# Patient Record
Sex: Male | Born: 1960 | Race: White | Hispanic: No | Marital: Married | Smoking: Never smoker
Health system: Southern US, Community
[De-identification: ages and names within clinical notes are randomized; demographics above are authoritative.]

## PROBLEM LIST (undated history)

## (undated) DIAGNOSIS — N200 Calculus of kidney: Secondary | ICD-10-CM

## (undated) DIAGNOSIS — G4730 Sleep apnea, unspecified: Secondary | ICD-10-CM

## (undated) DIAGNOSIS — M1990 Unspecified osteoarthritis, unspecified site: Secondary | ICD-10-CM

## (undated) DIAGNOSIS — I10 Essential (primary) hypertension: Secondary | ICD-10-CM

## (undated) DIAGNOSIS — Z87442 Personal history of urinary calculi: Secondary | ICD-10-CM

## (undated) DIAGNOSIS — D649 Anemia, unspecified: Secondary | ICD-10-CM

## (undated) DIAGNOSIS — K5790 Diverticulosis of intestine, part unspecified, without perforation or abscess without bleeding: Secondary | ICD-10-CM

## (undated) DIAGNOSIS — L02211 Cutaneous abscess of abdominal wall: Secondary | ICD-10-CM

## (undated) DIAGNOSIS — E785 Hyperlipidemia, unspecified: Secondary | ICD-10-CM

## (undated) DIAGNOSIS — N189 Chronic kidney disease, unspecified: Secondary | ICD-10-CM

## (undated) DIAGNOSIS — K219 Gastro-esophageal reflux disease without esophagitis: Secondary | ICD-10-CM

## (undated) HISTORY — PX: LAMINECTOMY: SHX219

## (undated) HISTORY — PX: BACK SURGERY: SHX140

## (undated) HISTORY — PX: CHOLECYSTECTOMY: SHX55

## (undated) SURGERY — LAPAROSCOPY, DIAGNOSTIC
Anesthesia: General

---

## 2004-04-08 ENCOUNTER — Other Ambulatory Visit: Payer: Self-pay

## 2004-04-12 ENCOUNTER — Ambulatory Visit: Payer: Self-pay | Admitting: Unknown Physician Specialty

## 2004-09-17 ENCOUNTER — Emergency Department: Payer: Self-pay | Admitting: Emergency Medicine

## 2005-11-20 ENCOUNTER — Emergency Department: Payer: Self-pay | Admitting: Internal Medicine

## 2005-11-21 ENCOUNTER — Ambulatory Visit: Payer: Self-pay | Admitting: Internal Medicine

## 2005-11-22 ENCOUNTER — Emergency Department: Payer: Self-pay | Admitting: Unknown Physician Specialty

## 2006-08-24 ENCOUNTER — Ambulatory Visit: Payer: Self-pay

## 2006-10-09 ENCOUNTER — Ambulatory Visit: Payer: Self-pay | Admitting: Cardiology

## 2007-02-23 ENCOUNTER — Emergency Department: Payer: Self-pay | Admitting: Emergency Medicine

## 2007-07-05 HISTORY — PX: CARDIAC CATHETERIZATION: SHX172

## 2011-07-05 HISTORY — PX: SPINAL FUSION W/ LUQUE UNIT ROD: SHX2426

## 2011-11-04 DIAGNOSIS — M4310 Spondylolisthesis, site unspecified: Secondary | ICD-10-CM | POA: Insufficient documentation

## 2011-11-04 DIAGNOSIS — M48062 Spinal stenosis, lumbar region with neurogenic claudication: Secondary | ICD-10-CM | POA: Insufficient documentation

## 2011-11-04 DIAGNOSIS — IMO0002 Reserved for concepts with insufficient information to code with codable children: Secondary | ICD-10-CM | POA: Insufficient documentation

## 2012-03-07 DIAGNOSIS — M25569 Pain in unspecified knee: Secondary | ICD-10-CM | POA: Insufficient documentation

## 2013-01-10 ENCOUNTER — Emergency Department: Payer: Self-pay | Admitting: Unknown Physician Specialty

## 2013-01-10 LAB — COMPREHENSIVE METABOLIC PANEL
Albumin: 3.8 g/dL (ref 3.4–5.0)
Alkaline Phosphatase: 131 U/L (ref 50–136)
Calcium, Total: 8.8 mg/dL (ref 8.5–10.1)
Chloride: 105 mmol/L (ref 98–107)
Co2: 26 mmol/L (ref 21–32)
Creatinine: 0.99 mg/dL (ref 0.60–1.30)
EGFR (Non-African Amer.): >60
Glucose: 107 mg/dL — ABNORMAL HIGH (ref 65–99)
Osmolality: 277 (ref 275–301)
Potassium: 3.9 mmol/L (ref 3.5–5.1)
SGOT(AST): 25 U/L (ref 15–37)

## 2013-01-10 LAB — CBC
MCHC: 34 g/dL (ref 32.0–36.0)
RBC: 4.66 10*6/uL (ref 4.40–5.90)
RDW: 13.6 % (ref 11.5–14.5)
WBC: 8.7 10*3/uL (ref 3.8–10.6)

## 2013-01-10 LAB — CK TOTAL AND CKMB (NOT AT ARMC): CK-MB: 1 ng/mL (ref 0.5–3.6)

## 2013-01-10 LAB — TROPONIN I: Troponin-I: <0.02 ng/mL

## 2013-11-04 DIAGNOSIS — M7550 Bursitis of unspecified shoulder: Secondary | ICD-10-CM | POA: Insufficient documentation

## 2013-11-04 DIAGNOSIS — M67919 Unspecified disorder of synovium and tendon, unspecified shoulder: Secondary | ICD-10-CM | POA: Insufficient documentation

## 2013-11-04 DIAGNOSIS — M719 Bursopathy, unspecified: Secondary | ICD-10-CM

## 2013-11-04 DIAGNOSIS — M542 Cervicalgia: Secondary | ICD-10-CM | POA: Insufficient documentation

## 2013-12-17 DIAGNOSIS — E291 Testicular hypofunction: Secondary | ICD-10-CM | POA: Insufficient documentation

## 2014-07-31 DIAGNOSIS — G4733 Obstructive sleep apnea (adult) (pediatric): Secondary | ICD-10-CM | POA: Insufficient documentation

## 2015-10-27 ENCOUNTER — Other Ambulatory Visit: Payer: Self-pay | Admitting: Family Medicine

## 2015-10-27 DIAGNOSIS — N2889 Other specified disorders of kidney and ureter: Secondary | ICD-10-CM

## 2015-10-29 ENCOUNTER — Ambulatory Visit
Admission: RE | Admit: 2015-10-29 | Discharge: 2015-10-29 | Disposition: A | Discharge status: Home / Self Care | Payer: BC Managed Care – PPO | Source: Ambulatory Visit | Attending: Family Medicine | Admitting: Family Medicine

## 2015-10-29 DIAGNOSIS — N2889 Other specified disorders of kidney and ureter: Secondary | ICD-10-CM | POA: Diagnosis present

## 2015-10-29 DIAGNOSIS — K760 Fatty (change of) liver, not elsewhere classified: Secondary | ICD-10-CM | POA: Insufficient documentation

## 2015-10-29 DIAGNOSIS — R938 Abnormal findings on diagnostic imaging of other specified body structures: Secondary | ICD-10-CM | POA: Diagnosis not present

## 2015-10-29 MED ORDER — GADOBENATE DIMEGLUMINE 529 MG/ML IV SOLN
20.0000 mL | Freq: Once | INTRAVENOUS | Status: AC | PRN
  Administered 2015-10-29: 20 mL via INTRAVENOUS

## 2016-01-22 ENCOUNTER — Other Ambulatory Visit: Payer: Self-pay | Admitting: Family Medicine

## 2016-01-22 ENCOUNTER — Ambulatory Visit
Admission: RE | Admit: 2016-01-22 | Discharge: 2016-01-22 | Disposition: A | Discharge status: Home / Self Care | Payer: BC Managed Care – PPO | Source: Ambulatory Visit | Attending: Family Medicine | Admitting: Family Medicine

## 2016-01-22 DIAGNOSIS — R1011 Right upper quadrant pain: Secondary | ICD-10-CM

## 2016-01-22 DIAGNOSIS — I700 Atherosclerosis of aorta: Secondary | ICD-10-CM | POA: Insufficient documentation

## 2016-01-22 DIAGNOSIS — R932 Abnormal findings on diagnostic imaging of liver and biliary tract: Secondary | ICD-10-CM | POA: Diagnosis not present

## 2016-01-22 HISTORY — DX: Essential (primary) hypertension: I10

## 2016-01-22 MED ORDER — IOPAMIDOL (ISOVUE-300) INJECTION 61%
100.0000 mL | Freq: Once | INTRAVENOUS | Status: AC | PRN
  Administered 2016-01-22: 100 mL via INTRAVENOUS

## 2016-01-26 ENCOUNTER — Other Ambulatory Visit: Payer: Self-pay

## 2016-01-26 DIAGNOSIS — F419 Anxiety disorder, unspecified: Secondary | ICD-10-CM | POA: Insufficient documentation

## 2016-01-26 DIAGNOSIS — E785 Hyperlipidemia, unspecified: Secondary | ICD-10-CM | POA: Insufficient documentation

## 2016-01-26 DIAGNOSIS — K219 Gastro-esophageal reflux disease without esophagitis: Secondary | ICD-10-CM | POA: Insufficient documentation

## 2016-01-26 DIAGNOSIS — I10 Essential (primary) hypertension: Secondary | ICD-10-CM | POA: Insufficient documentation

## 2016-01-27 ENCOUNTER — Ambulatory Visit: Payer: Self-pay | Admitting: Surgery

## 2016-01-28 ENCOUNTER — Telehealth: Payer: Self-pay

## 2016-01-28 ENCOUNTER — Ambulatory Visit (INDEPENDENT_AMBULATORY_CARE_PROVIDER_SITE_OTHER): Payer: Self-pay | Admitting: Surgery

## 2016-01-28 ENCOUNTER — Other Ambulatory Visit: Payer: Self-pay

## 2016-01-28 ENCOUNTER — Encounter: Payer: Self-pay | Admitting: Surgery

## 2016-01-28 ENCOUNTER — Other Ambulatory Visit
Admission: RE | Admit: 2016-01-28 | Discharge: 2016-01-28 | Disposition: A | Discharge status: Home / Self Care | Payer: BC Managed Care – PPO | Source: Ambulatory Visit | Attending: Surgery | Admitting: Surgery

## 2016-01-28 VITALS — BP 107/55 | HR 83 | Temp 98.8°F | Ht 66.0 in | Wt 250.0 lb

## 2016-01-28 DIAGNOSIS — L0390 Cellulitis, unspecified: Secondary | ICD-10-CM

## 2016-01-28 DIAGNOSIS — R1011 Right upper quadrant pain: Secondary | ICD-10-CM | POA: Diagnosis not present

## 2016-01-28 DIAGNOSIS — L0291 Cutaneous abscess, unspecified: Secondary | ICD-10-CM

## 2016-01-28 LAB — COMPREHENSIVE METABOLIC PANEL
ALBUMIN: 3.3 g/dL — AB (ref 3.5–5.0)
ALT: 103 U/L — AB (ref 17–63)
AST: 52 U/L — AB (ref 15–41)
Alkaline Phosphatase: 100 U/L (ref 38–126)
Anion gap: 12 (ref 5–15)
BUN: 18 mg/dL (ref 6–20)
CHLORIDE: 97 mmol/L — AB (ref 101–111)
CO2: 25 mmol/L (ref 22–32)
CREATININE: 0.99 mg/dL (ref 0.61–1.24)
Calcium: 9.4 mg/dL (ref 8.9–10.3)
GFR calc Af Amer: >60 mL/min (ref >60)
GLUCOSE: 102 mg/dL — AB (ref 65–99)
POTASSIUM: 4.4 mmol/L (ref 3.5–5.1)
Sodium: 134 mmol/L — ABNORMAL LOW (ref 135–145)
Total Bilirubin: 1 mg/dL (ref 0.3–1.2)
Total Protein: 7.8 g/dL (ref 6.5–8.1)

## 2016-01-28 LAB — CBC WITH DIFFERENTIAL/PLATELET
BASOS ABS: 0.1 10*3/uL (ref 0–0.1)
BASOS PCT: 1 %
EOS PCT: 1 %
Eosinophils Absolute: 0.1 10*3/uL (ref 0–0.7)
HCT: 39.5 % — ABNORMAL LOW (ref 40.0–52.0)
Hemoglobin: 12.9 g/dL — ABNORMAL LOW (ref 13.0–18.0)
LYMPHS PCT: 13 %
Lymphs Abs: 1.3 10*3/uL (ref 1.0–3.6)
MCH: 27.8 pg (ref 26.0–34.0)
MCHC: 32.7 g/dL (ref 32.0–36.0)
MCV: 85.2 fL (ref 80.0–100.0)
Monocytes Absolute: 0.9 10*3/uL (ref 0.2–1.0)
Monocytes Relative: 9 %
NEUTROS ABS: 7.7 10*3/uL — AB (ref 1.4–6.5)
Neutrophils Relative %: 76 %
PLATELETS: 489 10*3/uL — AB (ref 150–440)
RBC: 4.64 MIL/uL (ref 4.40–5.90)
RDW: 13.8 % (ref 11.5–14.5)
WBC: 10 10*3/uL (ref 3.8–10.6)

## 2016-01-28 MED ORDER — AMOXICILLIN-POT CLAVULANATE 875-125 MG PO TABS: 1.0000 | ORAL_TABLET | Freq: Two times a day (BID) | ORAL | 1 refills | Status: DC

## 2016-01-28 NOTE — Consult Note (Signed)
Surgical Consultation  01/28/2016  Danny Klein is an 55 y.o. male.   CC: Right-sided abdominal pain  HPI: This a patient who had an emergency cholecystectomy performed laparoscopically at [facility] in April 2017. He had elevated liver function tests the day after surgery but was discharged with a diagnosis of hydropic acute cholecystitis. 3 weeks later he had episodes of pain and not feeling well and then developed fevers and a workup suggested a fluid collection but definite fluid collection apparently was too small to drain and he was placed on the first IV then oral antibiotics and he resolved. He was doing fine until this last week at which time he started having increasing pain nausea to emesis loose stools and fevers to 102 his last fever to 102 was on Monday 4 days ago but last night he had a temp of under 100 at 99.6. Has been on oral Augmentin since Friday when he had a CT scan suggesting inflammation but no abscess. He complains of worsening stabbing pain that comes and goes and is been worsening over the last week. 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 Hypertension     Past Surgical History:  Procedure Laterality Date  . CARDIAC CATHETERIZATION  2009  . CHOLECYSTECTOMY    . SPINAL FUSION W/ LUQUE UNIT ROD  2013    Family History  Problem Relation Age of Onset  . Cancer Mother   . Heart disease Mother   . Heart disease Father     Social History:  reports that he has never smoked. He has never used smokeless tobacco. His alcohol and drug histories are not on file.  Allergies:  Allergies  Allergen Reactions  . Hydrocodone Itching and Other (See Comments)  . Meperidine Other (See Comments)    Nausea and vomiting , talking out of head     Medications reviewed.   Review of Systems:   Review of Systems  Constitutional: Positive for chills, fever,  malaise/fatigue and weight loss.  HENT: Negative.   Eyes: Negative.   Respiratory: Negative.   Cardiovascular: Negative.   Gastrointestinal: Positive for abdominal pain, diarrhea, nausea and vomiting. Negative for blood in stool, constipation, heartburn and melena.  Genitourinary: Negative.   Musculoskeletal: Negative.   Skin: Negative.   Neurological: Negative.   Endo/Heme/Allergies: Negative.   Psychiatric/Behavioral: Negative.      Physical Exam:  BP (!) 107/55 (BP Location: Right Arm, Patient Position: Sitting, Cuff Size: Normal)   Pulse 83   Temp 98.8 F (37.1 C) (Oral)   Ht  (1.676 m)   Wt 250 lb (113.4 kg)   BMI 40.35 kg/m   Physical Exam  Constitutional: He is oriented to person, place, and time. He appears distressed.  Uncomfortable-appearing male patient holding his right side  HENT:  Head: Normocephalic and atraumatic.  Eyes: Right eye exhibits no discharge. Left eye exhibits no discharge. No scleral icterus.  Neck: Normal range of motion.  Cardiovascular: Normal rate, regular rhythm and normal heart sounds.   Pulmonary/Chest: Effort normal and breath sounds normal. No respiratory distress. He has no wheezes. He has no rales.  Abdominal: Soft. He exhibits no distension. There is tenderness. There is no rebound and no guarding.  Tenderness in the right abdomen and right flank without peritoneal signs negative Rovsing sign no guarding no rebound no percussion tenderness Scars from recent cholecystectomy  are healing well without erythema or drainage no jaundice  Musculoskeletal: Normal range of motion. He exhibits no edema or tenderness.  Lymphadenopathy:    He has no cervical adenopathy.  Neurological: He is alert and oriented to person, place, and time.  Skin: Skin is warm and dry. No rash noted. He is not diaphoretic. No erythema.  Vitals reviewed.     No results found for this or any previous visit (from the past 48 hour(s)). No results  found.  Assessment/Plan:  CT scan from last week is personally reviewed showing inflammatory process such as a phlegmon without obvious drainable pocket. White blood cell count is normal as are LFTs.  Review of operative report from [facility] as well as perioperative labs and studies at that time and recent MRI personally. At an elevated white blood cell count with normal labs preoperatively (LFTs) but then had elevated LFTs postoperatively  Etiology of this patient's pain is clearly related to the phlegmon in his right flank but the etiology of that phlegmon is not clear to me. While it likely represents an inflammatory process following the cholecystectomy I see no sign that this would be a bile leak although HIDA scan may be helpful there. Additionally a repeat CT scan may be of value as well.  I spent more hour 20 minutes discussing with this patient possible etiologies. I doubt that this is kidney stone related as does the patient he has had them multiple times before and this is nothing like that. Location of phlegmon suggests it is related to his post cholecystectomy recovery. His fevers have improved but his pain has not and without in mind I will order CT scan today I will refill his Augmentin and recheck CBC and CMP. The CT scan suggests a drainable collection that could be done at the hospital. If there is no drainable collection then I would recommend staying on oral antibiotics and being seen next week. Should he worsen at any time admission to the hospital and IV antibiotics may be indicated.  The patient does not want to return to [facility] but I also suggested that if we cannot find an identifiable source or cause for this patient's phlegmon and it does not improve rapidly then referral to [facility] might be necessary in view of the hepatobiliary service. She was in agreement and understanding of this plan.    Lattie Haw, MD, FACS

## 2016-01-28 NOTE — Telephone Encounter (Signed)
Patient notified of prior authorization for CT scan.  Call placed to angie at this time.  Discussed with patient that I would notify him as soon as I hear from [employer].  Patient verbalized understanding.

## 2016-01-28 NOTE — Patient Instructions (Addendum)
Please call our office if you have questions or concerns. I will call you later with an appointment for your CT scan.

## 2016-01-29 ENCOUNTER — Ambulatory Visit
Admission: RE | Admit: 2016-01-29 | Discharge: 2016-01-29 | Disposition: A | Discharge status: Home / Self Care | Payer: BC Managed Care – PPO | Source: Ambulatory Visit | Attending: Surgery | Admitting: Surgery

## 2016-01-29 DIAGNOSIS — R1011 Right upper quadrant pain: Secondary | ICD-10-CM | POA: Diagnosis not present

## 2016-01-29 MED ORDER — IOPAMIDOL (ISOVUE-300) INJECTION 61%
125.0000 mL | Freq: Once | INTRAVENOUS | Status: AC | PRN
  Administered 2016-01-29: 11:00:00 via INTRAVENOUS

## 2016-01-29 NOTE — Telephone Encounter (Signed)
Contacted  Dr.Loflin at this time-Waiting for a response. For her to check CT results.

## 2016-01-29 NOTE — Telephone Encounter (Signed)
Patient called and wanted to know the results of his CT scan.  I messaged Dr.Loflin and have not received a response due to her being in surgery.  I told patient that the  Ct scan was still under review and it would most likely be Monday before I would have any results for him.  Patient stated he was continuing to run a low grade fever in the evenings and I asked if he was still taking his antibiotic and was told yes. I told him if he gets worse to go to the ED. Patient verbalized understanding.

## 2016-02-01 NOTE — Telephone Encounter (Signed)
Per Dr. Tonita Cong. Stated if patient has elevated fever, increased pain to go to ED if he does not want to wait until his appointment on 02/04/16.  I spoke with patient and we discussed the above. The patient stated the last time he visited the ED he did not have a good experience and unless he gets worse he would see [place] at his appointment on 02/03/16. He stated he was feeling the same as when he was in the office last week. He was instructed to continue his antibiotic and to go to the ED if he gets worse. Patient verbalized understanding.

## 2016-02-02 ENCOUNTER — Ambulatory Visit: Payer: BC Managed Care – PPO

## 2016-02-03 ENCOUNTER — Encounter: Payer: Self-pay | Admitting: Surgery

## 2016-02-03 ENCOUNTER — Inpatient Hospital Stay
Admission: AD | Admit: 2016-02-03 | Discharge: 2016-02-05 | DRG: 373 | Disposition: A | Discharge status: Home / Self Care | Payer: BC Managed Care – PPO | Source: Ambulatory Visit | Attending: Surgery | Admitting: Surgery

## 2016-02-03 ENCOUNTER — Ambulatory Visit (INDEPENDENT_AMBULATORY_CARE_PROVIDER_SITE_OTHER): Payer: BC Managed Care – PPO | Admitting: Surgery

## 2016-02-03 ENCOUNTER — Telehealth: Payer: Self-pay

## 2016-02-03 VITALS — BP 93/62 | HR 84 | Temp 98.0°F | Ht 66.0 in | Wt 249.0 lb

## 2016-02-03 DIAGNOSIS — I10 Essential (primary) hypertension: Secondary | ICD-10-CM | POA: Diagnosis present

## 2016-02-03 DIAGNOSIS — G8918 Other acute postprocedural pain: Secondary | ICD-10-CM | POA: Diagnosis not present

## 2016-02-03 DIAGNOSIS — IMO0001 Reserved for inherently not codable concepts without codable children: Secondary | ICD-10-CM

## 2016-02-03 DIAGNOSIS — R109 Unspecified abdominal pain: Secondary | ICD-10-CM

## 2016-02-03 DIAGNOSIS — T814XXD Infection following a procedure, subsequent encounter: Secondary | ICD-10-CM | POA: Diagnosis not present

## 2016-02-03 DIAGNOSIS — K651 Peritoneal abscess: Secondary | ICD-10-CM | POA: Diagnosis present

## 2016-02-03 DIAGNOSIS — Z9049 Acquired absence of other specified parts of digestive tract: Secondary | ICD-10-CM | POA: Diagnosis not present

## 2016-02-03 DIAGNOSIS — Z885 Allergy status to narcotic agent status: Secondary | ICD-10-CM

## 2016-02-03 DIAGNOSIS — IMO0002 Reserved for concepts with insufficient information to code with codable children: Secondary | ICD-10-CM | POA: Diagnosis present

## 2016-02-03 DIAGNOSIS — Z888 Allergy status to other drugs, medicaments and biological substances status: Secondary | ICD-10-CM

## 2016-02-03 LAB — CBC
HEMATOCRIT: 36.3 % — AB (ref 40.0–52.0)
HEMOGLOBIN: 12.4 g/dL — AB (ref 13.0–18.0)
MCH: 28.5 pg (ref 26.0–34.0)
MCHC: 34.1 g/dL (ref 32.0–36.0)
MCV: 83.7 fL (ref 80.0–100.0)
Platelets: 408 10*3/uL (ref 150–440)
RBC: 4.34 MIL/uL — AB (ref 4.40–5.90)
RDW: 14 % (ref 11.5–14.5)
WBC: 9.4 10*3/uL (ref 3.8–10.6)

## 2016-02-03 LAB — APTT: APTT: 34 s (ref 24–36)

## 2016-02-03 LAB — PROTIME-INR
INR: 1.09
Prothrombin Time: 14.1 seconds (ref 11.4–15.2)

## 2016-02-03 MED ORDER — HYDRALAZINE HCL 20 MG/ML IJ SOLN: 10.0000 mg | INTRAMUSCULAR | Status: DC | PRN

## 2016-02-03 MED ORDER — PIPERACILLIN-TAZOBACTAM 3.375 G IVPB
3.3750 g | Freq: Three times a day (TID) | INTRAVENOUS | Status: DC
  Administered 2016-02-03 – 2016-02-05 (×5): 3.375 g via INTRAVENOUS
  Filled 2016-02-03 (×9): qty 50

## 2016-02-03 MED ORDER — KETOROLAC TROMETHAMINE 30 MG/ML IJ SOLN
30.0000 mg | Freq: Four times a day (QID) | INTRAMUSCULAR | Status: DC | PRN
  Filled 2016-02-03: qty 1

## 2016-02-03 MED ORDER — OXYCODONE HCL 5 MG PO TABS: 5.0000 mg | ORAL_TABLET | ORAL | Status: DC | PRN

## 2016-02-03 MED ORDER — ACETAMINOPHEN 500 MG PO TABS
1000.0000 mg | ORAL_TABLET | Freq: Four times a day (QID) | ORAL | Status: DC
  Administered 2016-02-03 – 2016-02-05 (×5): 1000 mg via ORAL
  Filled 2016-02-03 (×6): qty 2

## 2016-02-03 MED ORDER — CYCLOBENZAPRINE HCL 10 MG PO TABS: 5.0000 mg | ORAL_TABLET | Freq: Three times a day (TID) | ORAL | Status: DC | PRN

## 2016-02-03 MED ORDER — LACTATED RINGERS IV SOLN
INTRAVENOUS | Status: DC
  Administered 2016-02-03 – 2016-02-05 (×3): via INTRAVENOUS

## 2016-02-03 MED ORDER — ONDANSETRON 4 MG PO TBDP: 4.0000 mg | ORAL_TABLET | Freq: Four times a day (QID) | ORAL | Status: DC | PRN

## 2016-02-03 MED ORDER — ONDANSETRON HCL 4 MG/2ML IJ SOLN: 4.0000 mg | Freq: Four times a day (QID) | INTRAMUSCULAR | Status: DC | PRN

## 2016-02-03 MED ORDER — PANTOPRAZOLE SODIUM 40 MG PO TBEC
40.0000 mg | DELAYED_RELEASE_TABLET | Freq: Every day | ORAL | Status: DC
  Administered 2016-02-03 – 2016-02-05 (×2): 40 mg via ORAL
  Filled 2016-02-03 (×2): qty 1

## 2016-02-03 NOTE — H&P (Signed)
Danny Klein is an 55 y.o. male.   Chief Complaint: fever, chills, abdominal pain  HPI: 55 year old who had a laparoscopic cholecystectomy at [facility] back on Easter weekend for gangrenous cholecystitis.  Patient states that he stayed in the hospital for about 3 days afterwards and continued to have some right-sided pain. Patient states that since that time he has continued to have pain in his right lateral side that has been cramping in nature as well as fevers chills night sweats and no weight loss of about 30 pounds over this time. The patient was seen by my partner Dr. Excell Seltzer last week and he ordered a repeat CT scan which showed an increasing intra-abdominal phlegmon/abscess collection in the right lateral upper quadrant. The patient is very frustrated at this time and occasionally tearful about the amount of pain he has been through and the timeframe. Patient states that even if he needs another surgery he is ready to have something done.  Past Medical History:  Diagnosis Date  . Hypertension     Past Surgical History:  Procedure Laterality Date  . CARDIAC CATHETERIZATION  2009  . CHOLECYSTECTOMY    . SPINAL FUSION W/ LUQUE UNIT ROD  2013    Family History  Problem Relation Age of Onset  . Cancer Mother   . Heart disease Mother   . Heart disease Father    Social History:  reports that he has never smoked. He has never used smokeless tobacco. His alcohol and drug histories are not on file.  Allergies:  Allergies  Allergen Reactions  . Hydrocodone Itching and Other (See Comments)  . Meperidine Other (See Comments)    Nausea and vomiting , talking out of head      (Not in a hospital admission)  No results found for this or any previous visit (from the past 48 hour(s)). No results found.  Review of Systems  Constitutional: Positive for chills, diaphoresis, fever, malaise/fatigue and weight loss.  HENT: Negative for congestion and sore throat.   Respiratory: Negative for  cough, shortness of breath and wheezing.   Cardiovascular: Negative for chest pain, palpitations and leg swelling.  Gastrointestinal: Positive for abdominal pain, heartburn, nausea and vomiting. Negative for constipation and diarrhea.  Genitourinary: Negative for dysuria, hematuria and urgency.  Musculoskeletal: Negative for back pain, joint pain and neck pain.  Skin: Negative for itching and rash.  Neurological: Positive for weakness. Negative for dizziness, seizures, loss of consciousness and headaches.  Psychiatric/Behavioral: Negative for depression. The patient is nervous/anxious.   All other systems reviewed and are negative.   Blood pressure 93/62, pulse 84, temperature 98 F (36.7 C), temperature source Oral, height  (1.676 m), weight 249 lb (112.9 kg). Physical Exam  Vitals reviewed. Constitutional: He is oriented to person, place, and time. He appears well-developed and well-nourished. No distress.  HENT:  Head: Normocephalic and atraumatic.  Right Ear: External ear normal.  Left Ear: External ear normal.  Nose: Nose normal.  Mouth/Throat: Oropharynx is clear and moist. No oropharyngeal exudate.  Eyes: Conjunctivae and EOM are normal. Pupils are equal, round, and reactive to light. No scleral icterus.  Neck: Normal range of motion. Neck supple. No tracheal deviation present.  Cardiovascular: Normal rate, regular rhythm, normal heart sounds and intact distal pulses.  Exam reveals no gallop and no friction rub.   No murmur heard. Respiratory: Effort normal and breath sounds normal. No respiratory distress. He has no wheezes. He has no rales.  GI: Soft. Bowel sounds are  normal. He exhibits no distension. There is tenderness. There is no rebound and no guarding.  Right sided abdominal pain  Musculoskeletal: Normal range of motion. He exhibits no edema, tenderness or deformity.  Neurological: He is alert and oriented to person, place, and time. No cranial nerve deficit.   Skin: Skin is warm and dry. No rash noted. No erythema. No pallor.  Psychiatric: He has a normal mood and affect. His behavior is normal. Judgment and thought content normal.    CBC Latest Ref Rng & Units 02/03/2016 01/28/2016 01/10/2013  WBC 3.8 - 10.6 K/uL 9.4 10.0 8.7  Hemoglobin 13.0 - 18.0 g/dL 12.4(L) 12.9(L) 14.0  Hematocrit 40.0 - 52.0 % 36.3(L) 39.5(L) 41.1  Platelets 150 - 440 K/uL 408 489(H) 240   CMP Latest Ref Rng & Units 01/28/2016 01/10/2013  Glucose 65 - 99 mg/dL 450(T) 888(K)  BUN 6 - 20 mg/dL 18 80(K)  Creatinine 3.49 - 1.24 mg/dL 1.79 1.50  Sodium 569 - 145 mmol/L 134(L) 137  Potassium 3.5 - 5.1 mmol/L 4.4 3.9  Chloride 101 - 111 mmol/L 97(L) 105  CO2 22 - 32 mmol/L 25 26  Calcium 8.9 - 10.3 mg/dL 9.4 8.8  Total Protein 6.5 - 8.1 g/dL 7.8 7.0  Total Bilirubin 0.3 - 1.2 mg/dL 1.0 0.7  Alkaline Phos 38 - 126 U/L 100 131  AST 15 - 41 U/L 52(H) 25  ALT 17 - 63 U/L 103(H) 29   CT: IMPRESSION: Slight worsening of the inflammatory/infectious right retroperitoneal/intra-abdominal collection, which communicates with the inferior most tip of the right lobe of the liver with small subcapsular extension. Associated tenting of the lateral abdominal wall suggest relatively longstanding process. There is no drainable fluid portion of this collection. Differential diagnosis includes persistent postsurgical collection versus hepatic/perihepatic abscess versus less likely focal mesenteric/retroperitoneal fat infarct. Assessment/Plan 55 year old male with intra-abdominal phlegmon/abscess after laparoscopic cholecystectomy in April. I personally reviewed the patient's past medical history including his time at [facility]. I have personally reviewed his laboratory values which do show a normal white blood cell count at 10 and mildly elevated LFTs in the 50s however and normal bilirubin at 1.0. The personally reviewed the patient's CT scans of the one from the 21st and the one from the 28th  where there is an area of stranding and the fat along the lateral right abdominal wall just at the inferior tip of the right lateral liver which does appear to have a small area of some fluid collection and appears to be slightly larger on the second CT scan. I also reviewed the radiology read in the chart.    As the patient is continuing to have fevers chills and abdominal pain in the area does appear that this area is likely an abscess. I proposed to the patient will be admitted to the hospital but him on a broad-spectrum antibiotic an attempt to see if our interventional radiologist would consider placing a drain in this area. Also discussed with the patient that if he can get a drain placed in this area would be an option to look laparoscopically and see if this area could be drained surgically. I also told him that if he gets better with broad-spectrum antibiotics might not need either. I did discuss this with Dr. Tonita Cong who is the day person in the hospital, and is aware of the patient that he is being admitted.  Gladis Riffle, MD 02/03/2016, 6:22 PM

## 2016-02-03 NOTE — Progress Notes (Signed)
Please see h&P

## 2016-02-03 NOTE — Telephone Encounter (Signed)
Patient was admitted today while in clinic. He was instructed to go to registration and then room 205. Patient verbalized understanding.

## 2016-02-04 ENCOUNTER — Inpatient Hospital Stay: Payer: BC Managed Care – PPO

## 2016-02-04 LAB — COMPREHENSIVE METABOLIC PANEL
ALT: 35 U/L (ref 17–63)
AST: 19 U/L (ref 15–41)
Albumin: 2.8 g/dL — ABNORMAL LOW (ref 3.5–5.0)
Alkaline Phosphatase: 83 U/L (ref 38–126)
Anion gap: 10 (ref 5–15)
BILIRUBIN TOTAL: 0.8 mg/dL (ref 0.3–1.2)
BUN: 23 mg/dL — ABNORMAL HIGH (ref 6–20)
CALCIUM: 9 mg/dL (ref 8.9–10.3)
CHLORIDE: 100 mmol/L — AB (ref 101–111)
CO2: 25 mmol/L (ref 22–32)
CREATININE: 0.94 mg/dL (ref 0.61–1.24)
Glucose, Bld: 97 mg/dL (ref 65–99)
Potassium: 4 mmol/L (ref 3.5–5.1)
Sodium: 135 mmol/L (ref 135–145)
TOTAL PROTEIN: 7 g/dL (ref 6.5–8.1)

## 2016-02-04 LAB — CBC
HCT: 34.4 % — ABNORMAL LOW (ref 40.0–52.0)
Hemoglobin: 11.7 g/dL — ABNORMAL LOW (ref 13.0–18.0)
MCH: 28.2 pg (ref 26.0–34.0)
MCHC: 33.9 g/dL (ref 32.0–36.0)
MCV: 83.2 fL (ref 80.0–100.0)
PLATELETS: 369 10*3/uL (ref 150–440)
RBC: 4.14 MIL/uL — AB (ref 4.40–5.90)
RDW: 13.8 % (ref 11.5–14.5)
WBC: 8.9 10*3/uL (ref 3.8–10.6)

## 2016-02-04 MED ORDER — MIDAZOLAM HCL 5 MG/5ML IJ SOLN
INTRAMUSCULAR | Status: AC
  Filled 2016-02-04: qty 5

## 2016-02-04 MED ORDER — FENTANYL CITRATE (PF) 100 MCG/2ML IJ SOLN
INTRAMUSCULAR | Status: AC
  Filled 2016-02-04: qty 4

## 2016-02-04 MED ORDER — MIDAZOLAM HCL 5 MG/5ML IJ SOLN
INTRAMUSCULAR | Status: AC | PRN
  Administered 2016-02-04 (×2): 1 mg via INTRAVENOUS

## 2016-02-04 MED ORDER — FENTANYL CITRATE (PF) 100 MCG/2ML IJ SOLN
INTRAMUSCULAR | Status: AC | PRN
  Administered 2016-02-04: 50 ug via INTRAVENOUS

## 2016-02-04 NOTE — Progress Notes (Signed)
  Subjective: Patient is scheduled for CT guided aspiraiton/drainage of irregular collection along inferior aspect of liver.  History of cholecystectomy with on and off pain since the surgery.  Objective: Vital signs in last 24 hours: Temp:  [97.7 F (36.5 C)-98.4 F (36.9 C)] 98.4 F (36.9 C) (08/03 1210) Pulse Rate:  [71-84] 72 (08/03 1210) Resp:  [16-18] 18 (08/03 1210) BP: (89-122)/(42-58) 114/52 (08/03 1210) SpO2:  [97 %-100 %] 100 % (08/03 1210) Weight:  [239 lb (108.4 kg)] 239 lb (108.4 kg) (08/02 1615) Last BM Date: 02/03/16  Intake/Output from previous day: 08/02 0701 - 08/03 0700 In: 650 [I.V.:600; IV Piggyback:50] Out: 400 [Urine:400] Intake/Output this shift: Total I/O In: 99 [I.V.:99] Out: 225 [Urine:225]  Heart: RRR Lungs: CTAB Abd:  Bowel sounds.  Soft.  Pain in right flank  Lab Results:   Recent Labs  02/03/16 1615 02/04/16 0534  WBC 9.4 8.9  HGB 12.4* 11.7*  HCT 36.3* 34.4*  PLT 408 369   BMET  Recent Labs  02/04/16 0534  NA 135  K 4.0  CL 100*  CO2 25  GLUCOSE 97  BUN 23*  CREATININE 0.94  CALCIUM 9.0   PT/INR  Recent Labs  02/03/16 1714  LABPROT 14.1  INR 1.09   ABG No results for input(s): PHART, HCO3 in the last 72 hours.  Invalid input(s): PCO2, PO2  Studies/Results: No results found.  Anti-infectives: Anti-infectives    Start     Dose/Rate Route Frequency Ordered Stop   02/03/16 1615  piperacillin-tazobactam (ZOSYN) IVPB 3.375 g     3.375 g 12.5 mL/hr over 240 Minutes Intravenous Every 8 hours 02/03/16 1602        Assessment/Plan: History of cholecystectomy with a persistent inflammatory collection along inferior aspect of liver.  Admission history and physical have been reviewed.  Based on appearance of collection, we may only be able to aspirate the collection and this was discussed with patient.  If a drain can be placed, this will be attempted.  Informed consent obtained from patient for CT guided  aspiration/drainage of the right abdominal collection.     LOS: 1 day    Lark Langenfeld RYAN 02/04/2016

## 2016-02-04 NOTE — Progress Notes (Signed)
CC: Abdominal pain Subjective: Patient medical overnight by my partner with abdominal pain thought to be secondary to a gangrenous cholecystitis that was treated many months ago. Patient reports that with initiation of IV antibiotics he has started to feel better. He denies any current fevers or chills. His abdomen continues to bother him some but not as bad as when he came in to the hospital.  Objective: Vital signs in last 24 hours: Temp:  [97.7 F (36.5 C)-98.3 F (36.8 C)] 97.7 F (36.5 C) (08/03 0844) Pulse Rate:  [71-84] 72 (08/03 0844) Resp:  [16-17] 16 (08/03 0445) BP: (89-122)/(42-62) 122/49 (08/03 0844) SpO2:  [97 %-100 %] 98 % (08/03 0844) Weight:  [108.4 kg (239 lb)-112.9 kg (249 lb)] 108.4 kg (239 lb) (08/02 1615) Last BM Date: 02/03/16  Intake/Output from previous day: 08/02 0701 - 08/03 0700 In: 650 [I.V.:600; IV Piggyback:50] Out: 400 [Urine:400] Intake/Output this shift: Total I/O In: 99 [I.V.:99] Out: -   Physical exam:  Gen.: No acute distress Chest: Clear to auscultation Heart: Regular rate and rhythm Abdomen: Soft, minimally tender to deep palpation right upper quadrant, nondistended.  Lab Results: CBC   Recent Labs  02/03/16 1615 02/04/16 0534  WBC 9.4 8.9  HGB 12.4* 11.7*  HCT 36.3* 34.4*  PLT 408 369   BMET  Recent Labs  02/04/16 0534  NA 135  K 4.0  CL 100*  CO2 25  GLUCOSE 97  BUN 23*  CREATININE 0.94  CALCIUM 9.0   PT/INR  Recent Labs  02/03/16 1714  LABPROT 14.1  INR 1.09   ABG No results for input(s): PHART, HCO3 in the last 72 hours.  Invalid input(s): PCO2, PO2  Studies/Results: No results found.  Anti-infectives: Anti-infectives    Start     Dose/Rate Route Frequency Ordered Stop   02/03/16 1615  piperacillin-tazobactam (ZOSYN) IVPB 3.375 g     3.375 g 12.5 mL/hr over 240 Minutes Intravenous Every 8 hours 02/03/16 1602        Assessment/Plan:  55 year old male with likely perihepatic abscess from  previous gangrenous cholecystitis. Improved on IV antibiotic. Discussed with Dr. Orvis Brill. We will send for attempted IR drainage versus aspiration. Continue IV antibiotics. If unable be successfully drained by IR would consider laparoscopic intervention. However, should he completely resolve on antibiotics would just treat with a course of antibiotics at this time if there is no drainable fluid collection on imaging. All questions answered to patient's satisfaction. Encourage ambulation.  Jeno Calleros T. Tonita Cong, MD, FACS  02/04/2016

## 2016-02-04 NOTE — Progress Notes (Signed)
Initial Nutrition Assessment  DOCUMENTATION CODES:   Severe malnutrition in context of acute illness/injury  INTERVENTION:  -Monitor diet progression and intake once able to take po -Recommend Ensure Enlive po BID, each supplement provides 350 kcal and 20 grams of protein, once diet progressed -Encouraged lean protein and overall healthy diet once able to take po.  Pt verbalized understanding and expect good compliance    NUTRITION DIAGNOSIS:   Malnutrition related to altered GI function, poor appetite as evidenced by percent weight loss, energy intake < or equal to 50% for > or equal to 5 days.    GOAL:   Patient will meet greater than or equal to 90% of their needs    MONITOR:   Diet advancement, PO intake, Supplement acceptance, Weight trends  REASON FOR ASSESSMENT:   Malnutrition Screening Tool    ASSESSMENT:      Pt admitted with perihepatic abscess from gangrenous cholecysititis.  Lap cholecystectomy done at [facility] in April  Past Medical History:  Diagnosis Date  . Hypertension    Planning IR for drainage of abscess today. Pt reports for the past month or more has had no appetite.  Eating a child's portion at meal times, gets full quickly  Medications reviewed: protonix, LR at 47ml/hr Labs reviewed: BUN 23, creatinine WDL  Nutrition-Focused physical exam completed. Findings are WDL for fat depletion, muscle depletion, and edema.   Diet Order:  Diet NPO time specified  Skin:  Reviewed, no issues  Last BM:  8/2  Height:   Ht Readings from Last 1 Encounters:  02/03/16 5\' 10"  (1.778 m)    Weight: Pt reports 35-40 pound wt loss in the last 2- 3 months (13-14% wt loss in the last 2- 3 months)  Wt Readings from Last 1 Encounters:  02/03/16 239 lb (108.4 kg)    Ideal Body Weight:     BMI:  Body mass index is 34.29 kg/m.  Estimated Nutritional Needs:   Kcal:  0233-4356 kcals/d  Protein:  118-130 g/d  Fluid:  >/= 2L/d  EDUCATION NEEDS:    Education needs addressed  Karlon Schlafer B. Freida Busman, RD, LDN [phone] (pager) Weekend/On-Call pager [phone])

## 2016-02-04 NOTE — Procedures (Signed)
CT guided aspiration of right abdominal inflammatory collection.  Needle directed into different areas of this small collection.  Only a few drops of yellow purulent fluid could be aspirated as the needle was withdrawn.  Collection is too small for drain placement.  Send fluid for culture.  No immediate complication.

## 2016-02-05 MED ORDER — AMOXICILLIN-POT CLAVULANATE 875-125 MG PO TABS: 1.0000 | ORAL_TABLET | Freq: Two times a day (BID) | ORAL | 0 refills | Status: DC

## 2016-02-05 MED ORDER — OXYCODONE HCL 5 MG PO TABS: 5.0000 mg | ORAL_TABLET | ORAL | 0 refills | Status: DC | PRN

## 2016-02-05 MED ORDER — AMOXICILLIN-POT CLAVULANATE 875-125 MG PO TABS
1.0000 | ORAL_TABLET | Freq: Two times a day (BID) | ORAL | Status: DC
  Administered 2016-02-05: 1 via ORAL
  Filled 2016-02-05: qty 1

## 2016-02-05 NOTE — Discharge Summary (Signed)
Patient ID: Danny Klein MRN: [id number] DOB/AGE: 1961-07-02 55 y.o.  Admit date: 02/03/2016 Discharge date: 02/05/2016  Discharge Diagnoses:  Perihepatic abscess  Procedures Performed: IR drainage of perihepatic abscess  Discharged Condition: good  Hospital Course: Patient admitted to the hospital from clinic with a diagnosis of a likely perihepatic abscess. Underwent percutaneous aspiration of this via IR. Felt much better after the aspiration was able to transition to oral antibiotic. Discharged home and on all oral medications.  Discharge Orders:  home  Disposition: Home  Discharge Medications:   Medication List    TAKE these medications   amLODipine 10 MG tablet Commonly known as:  NORVASC Take by mouth.   amoxicillin-clavulanate 875-125 MG tablet Commonly known as:  AUGMENTIN Take 1 tablet by mouth every 12 (twelve) hours. What changed:  when to take this   lisinopril-hydrochlorothiazide 20-25 MG tablet Commonly known as:  PRINZIDE,ZESTORETIC Take by mouth.   oxyCODONE 5 MG immediate release tablet Commonly known as:  Oxy IR/ROXICODONE Take 1-2 tablets (5-10 mg total) by mouth every 4 (four) hours as needed for moderate pain.   PRILOSEC OTC 20 MG tablet Generic drug:  omeprazole Take 20 mg by mouth.   RA VITAMIN B-12 TR 1000 MCG Tbcr Generic drug:  Cyanocobalamin Take by mouth.   rosuvastatin 20 MG tablet Commonly known as:  CRESTOR Take by mouth.        Follwup: Follow-up Information    [facility]. Go in 1 week(s).   Specialty:  General Surgery Why:  post drainage Contact information: [address] [place] [address] [phone]          Signed: Ricarda Frame 02/05/2016, 3:33 PM

## 2016-02-05 NOTE — Discharge Instructions (Signed)
Percutaneous Abscess Drain °An abscess is a collection of infected fluid inside the body. Your health care provider may decide to remove or drain the infected fluid from the area by placing a thin needle into the abscess. Usually, a small tube is left in place to drain the abscess fluid. The abscess fluid may take a few days to drain. °LET YOUR HEALTH CARE PROVIDER KNOW ABOUT: °· Any allergies you have. °· All medicines you are taking, including vitamins, herbs, eye drops, creams, and over-the-counter medicines. This includes steroid medicines by mouth or cream. °· Previous problems you or members of your family have had with the use of anesthetics. °· Any blood disorders you have. °· Previous surgeries you have had. °· Possibility of pregnancy, if this applies. °· Medical conditions you have. °· Any history of smoking. °RISKS AND COMPLICATIONS °Generally, this is a safe procedure. However, problems can occur and include:  °· Infection. °· Allergic reaction to materials used (such as contrast dye). °· Damage to a nearby organ or tissue. °· Bleeding. °· Blockage of a tube placed to drain the abscess, requiring placement of a new drainage tube. °· A need to repeat the procedure. °· Failure of the procedure to adequately drain the abscess, requiring an open surgical procedure to do so. °BEFORE THE PROCEDURE  °· Ask your health care provider about: °¨ Changing or stopping your regular medicines. This is especially important if you are taking diabetes medicines or blood thinners. °¨ Taking medicines such as aspirin and ibuprofen. These medicines can thin your blood. Do not take these medicines before your procedure if your health care provider asks you not to. °· Your health care provider may do some blood or urine tests. These will help your health care provider learn how well your kidneys and liver are working and how well your blood clots. °· Do not eat or drink anything after midnight on the night before the  procedure or as directed by your health care provider. °· Make arrangements for someone to drive you home after the procedure.   °PROCEDURE  °· An IV tube will be placed in your arm. Medicine will be able to flow directly into your body through this tube. °· You will lie on an X-ray table. °· Your heart rate, blood pressure, and breathing will be monitored.   °· Your oxygen level will also be watched during the procedure. Supplemental oxygen may be given if necessary. °· The skin around the area where the drainage tube (catheter) will be placed will be cleaned and numbed. °· A small cut (incision) will then be made to insert the drainage tube. The drainage tube will be inserted using X-ray or CT scan to help direct where it should be placed. °· The drainage tube will be guided into the abscess to drain the infected fluid. °· The drainage tube may stay in place and be connected to a bag outside your body. It will stay until the fluid has stopped draining and the infection is gone.    °AFTER THE PROCEDURE °· You will be taken to a recovery area where you will stay until the medicines have worn off. °· You will stay in bed for several hours. °· Your progress will be monitored.   °¨ Your blood pressure and pulse will be checked often.   °¨ The area of the incision will be checked often. °· You may have some pain or feel sick. Tell your health care provider. °· As you begin to feel better, you may be given ice,   fluids, and food.   °· When you can walk, drink, eat, and use the bathroom, you may be able to go home. °  °This information is not intended to replace advice given to you by your health care provider. Make sure you discuss any questions you have with your health care provider. °  °Document Released: 11/04/2013 Document Reviewed: 11/04/2013 °Elsevier Interactive Patient Education ©2016 [employer] ° °

## 2016-02-05 NOTE — Progress Notes (Signed)
CC: Abdominal pain Subjective: Patient reports that his pain continues to be markedly improved. Tolerating his diet and denies any fevers or chills. Having bowel function.  Objective: Vital signs in last 24 hours: Temp:  [98.2 F (36.8 C)-98.9 F (37.2 C)] 98.2 F (36.8 C) (08/04 0515) Pulse Rate:  [72-97] 78 (08/04 0515) Resp:  [17-24] 20 (08/04 0515) BP: (107-117)/(52-64) 111/60 (08/04 0515) SpO2:  [97 %-100 %] 97 % (08/04 0515) Last BM Date: 02/04/16  Intake/Output from previous day: 08/03 0701 - 08/04 0700 In: 1721 [I.V.:1679; IV Piggyback:42] Out: 775 [Urine:775] Intake/Output this shift: Total I/O In: 137.5 [I.V.:137.5] Out: -   Physical exam:  Gen.: No acute distress Chest: Clear to auscultation Heart: Regular rate and rhythm Abdomen: Soft, minimally tender to palpation in the right upper quadrant, nondistended.  Lab Results: CBC   Recent Labs  02/03/16 1615 02/04/16 0534  WBC 9.4 8.9  HGB 12.4* 11.7*  HCT 36.3* 34.4*  PLT 408 369   BMET  Recent Labs  02/04/16 0534  NA 135  K 4.0  CL 100*  CO2 25  GLUCOSE 97  BUN 23*  CREATININE 0.94  CALCIUM 9.0   PT/INR  Recent Labs  02/03/16 1714  LABPROT 14.1  INR 1.09   ABG No results for input(s): PHART, HCO3 in the last 72 hours.  Invalid input(s): PCO2, PO2  Studies/Results: Ct Image Guided Drainage By Percutaneous Catheter  Result Date: 02/04/2016 INDICATION: 55 year old with prior cholecystectomy. Patient has had persistent pain and discomfort in the right flank since the surgery. Patient has a suspicious inflammatory collection or abscess in the right abdomen inferior to the liver. Request for drainage or aspiration. EXAM: CT GUIDED ASPIRATION OF RIGHT ABDOMINAL FLUID COLLECTION MEDICATIONS: The patient is currently admitted to the hospital and receiving antibiotics. ANESTHESIA/SEDATION: 2.0 mg IV Versed 50 mcg IV Fentanyl Moderate Sedation Time:  18 minutes The patient was continuously  monitored during the procedure by the interventional radiology nurse under my direct supervision. COMPLICATIONS: None immediate. TECHNIQUE: Informed written consent was obtained from the patient after a thorough discussion of the procedural risks, benefits and alternatives. All questions were addressed. Maximal Sterile Barrier Technique was utilized including mask, sterile gowns, sterile gloves, sterile drape, hand hygiene and skin antiseptic. A timeout was performed prior to the initiation of the procedure. PROCEDURE: Patient was placed prone. The right flank was prepped with chlorhexidine in a sterile fashion, and a sterile drape was applied covering the operative field. A sterile gown and sterile gloves were used for the procedure. Local anesthesia was provided with 1% Lidocaine. 18 gauge trocar needle was directed into the right posterior abdominal collection with CT guidance. Needle placement was confirmed within the collection but unable to aspirate fluid. The needle was repositioned two times. Unable to aspirate any fluid with the needle in the collections. However, as the needle was withdrawn, a few drops of yellow purulent fluid was collected. Bandage placed over the puncture site. FINDINGS: Complex irregular low-density collections in the right posterior abdomen, inferior to the liver. Needle was directed into these collections. Only a few drops of purulent fluid could be aspirated. IMPRESSION: Successful CT-guided aspiration of the small irregular collection in the right posterior abdomen. Findings compatible with a tiny abscess. Fluid was sent for culture. Electronically Signed   By: Richarda Overlie M.D.   On: 02/04/2016 17:25    Anti-infectives: Anti-infectives    Start     Dose/Rate Route Frequency Ordered Stop   02/05/16 1000  amoxicillin-clavulanate (  AUGMENTIN) 875-125 MG per tablet 1 tablet     1 tablet Oral Every 12 hours 02/05/16 0831     02/03/16 1615  piperacillin-tazobactam (ZOSYN) IVPB  3.375 g  Status:  Discontinued     3.375 g 12.5 mL/hr over 240 Minutes Intravenous Every 8 hours 02/03/16 1602 02/05/16 0831      Assessment/Plan:  55 year old male status post IR aspiration of perihepatic fluid. Feels much better clinically. We will transition to oral antibiotics today. If tolerates antibiotics and pain stays resolved then likely discharge home later today. Encourage ambulation and incentive from her usage.  Marleigh Kaylor T. Tonita Cong, MD, FACS  02/05/2016

## 2016-02-09 ENCOUNTER — Inpatient Hospital Stay
Admission: EM | Admit: 2016-02-09 | Discharge: 2016-02-14 | DRG: 862 | Disposition: A | Discharge status: Home Health | Payer: BC Managed Care – PPO | Attending: Surgery | Admitting: Surgery

## 2016-02-09 ENCOUNTER — Emergency Department: Payer: BC Managed Care – PPO

## 2016-02-09 ENCOUNTER — Telehealth: Payer: Self-pay

## 2016-02-09 DIAGNOSIS — E7800 Pure hypercholesterolemia, unspecified: Secondary | ICD-10-CM | POA: Diagnosis present

## 2016-02-09 DIAGNOSIS — B999 Unspecified infectious disease: Secondary | ICD-10-CM

## 2016-02-09 DIAGNOSIS — K651 Peritoneal abscess: Secondary | ICD-10-CM

## 2016-02-09 DIAGNOSIS — Y838 Other surgical procedures as the cause of abnormal reaction of the patient, or of later complication, without mention of misadventure at the time of the procedure: Secondary | ICD-10-CM | POA: Diagnosis present

## 2016-02-09 DIAGNOSIS — IMO0002 Reserved for concepts with insufficient information to code with codable children: Secondary | ICD-10-CM

## 2016-02-09 DIAGNOSIS — T814XXA Infection following a procedure, initial encounter: Secondary | ICD-10-CM | POA: Diagnosis present

## 2016-02-09 DIAGNOSIS — R1011 Right upper quadrant pain: Secondary | ICD-10-CM | POA: Diagnosis present

## 2016-02-09 DIAGNOSIS — I10 Essential (primary) hypertension: Secondary | ICD-10-CM | POA: Diagnosis present

## 2016-02-09 DIAGNOSIS — Z452 Encounter for adjustment and management of vascular access device: Secondary | ICD-10-CM

## 2016-02-09 DIAGNOSIS — Z981 Arthrodesis status: Secondary | ICD-10-CM

## 2016-02-09 DIAGNOSIS — M2550 Pain in unspecified joint: Secondary | ICD-10-CM | POA: Diagnosis not present

## 2016-02-09 LAB — COMPREHENSIVE METABOLIC PANEL
ALBUMIN: 3.3 g/dL — AB (ref 3.5–5.0)
ALK PHOS: 88 U/L (ref 38–126)
ALT: 33 U/L (ref 17–63)
ANION GAP: 10 (ref 5–15)
AST: 26 U/L (ref 15–41)
BILIRUBIN TOTAL: 0.7 mg/dL (ref 0.3–1.2)
BUN: 16 mg/dL (ref 6–20)
CALCIUM: 9.4 mg/dL (ref 8.9–10.3)
CO2: 23 mmol/L (ref 22–32)
Chloride: 103 mmol/L (ref 101–111)
Creatinine, Ser: 0.83 mg/dL (ref 0.61–1.24)
GFR calc Af Amer: >60 mL/min (ref >60)
GFR calc non Af Amer: >60 mL/min (ref >60)
GLUCOSE: 100 mg/dL — AB (ref 65–99)
Potassium: 4 mmol/L (ref 3.5–5.1)
Sodium: 136 mmol/L (ref 135–145)
TOTAL PROTEIN: 7.8 g/dL (ref 6.5–8.1)

## 2016-02-09 LAB — CBC
HCT: 35.1 % — ABNORMAL LOW (ref 40.0–52.0)
HEMOGLOBIN: 12.2 g/dL — AB (ref 13.0–18.0)
MCH: 28.9 pg (ref 26.0–34.0)
MCHC: 34.7 g/dL (ref 32.0–36.0)
MCV: 83.3 fL (ref 80.0–100.0)
PLATELETS: 418 10*3/uL (ref 150–440)
RBC: 4.22 MIL/uL — ABNORMAL LOW (ref 4.40–5.90)
RDW: 14.1 % (ref 11.5–14.5)
WBC: 8 10*3/uL (ref 3.8–10.6)

## 2016-02-09 LAB — AEROBIC/ANAEROBIC CULTURE (SURGICAL/DEEP WOUND)

## 2016-02-09 LAB — AEROBIC/ANAEROBIC CULTURE W GRAM STAIN (SURGICAL/DEEP WOUND): Culture: NO GROWTH

## 2016-02-09 LAB — LIPASE, BLOOD: Lipase: 17 U/L (ref 11–51)

## 2016-02-09 MED ORDER — HYDROCHLOROTHIAZIDE 25 MG PO TABS
25.0000 mg | ORAL_TABLET | Freq: Every day | ORAL | Status: DC
  Filled 2016-02-09: qty 1

## 2016-02-09 MED ORDER — LACTATED RINGERS IV SOLN
INTRAVENOUS | Status: DC
  Administered 2016-02-09 – 2016-02-11 (×6): via INTRAVENOUS

## 2016-02-09 MED ORDER — AMLODIPINE BESYLATE 5 MG PO TABS
10.0000 mg | ORAL_TABLET | Freq: Every day | ORAL | Status: DC
  Filled 2016-02-09: qty 2

## 2016-02-09 MED ORDER — SODIUM CHLORIDE 0.9 % IV BOLUS (SEPSIS)
1000.0000 mL | Freq: Once | INTRAVENOUS | Status: AC
  Administered 2016-02-09: 1000 mL via INTRAVENOUS

## 2016-02-09 MED ORDER — VANCOMYCIN HCL IN DEXTROSE 1-5 GM/200ML-% IV SOLN
1000.0000 mg | Freq: Once | INTRAVENOUS | Status: AC
  Administered 2016-02-09: 1000 mg via INTRAVENOUS
  Filled 2016-02-09: qty 200

## 2016-02-09 MED ORDER — ONDANSETRON HCL 4 MG/2ML IJ SOLN
4.0000 mg | Freq: Once | INTRAMUSCULAR | Status: AC
  Administered 2016-02-09: 4 mg via INTRAVENOUS

## 2016-02-09 MED ORDER — OXYCODONE HCL 5 MG PO TABS
5.0000 mg | ORAL_TABLET | ORAL | Status: DC | PRN
  Administered 2016-02-09 – 2016-02-10 (×3): 5 mg via ORAL
  Administered 2016-02-10 (×2): 10 mg via ORAL
  Administered 2016-02-11: 5 mg via ORAL
  Administered 2016-02-11: 10 mg via ORAL
  Administered 2016-02-11: 5 mg via ORAL
  Administered 2016-02-12: 10 mg via ORAL
  Administered 2016-02-12 – 2016-02-14 (×4): 5 mg via ORAL
  Filled 2016-02-09 (×2): qty 1
  Filled 2016-02-09: qty 2
  Filled 2016-02-09: qty 1
  Filled 2016-02-09: qty 2
  Filled 2016-02-09: qty 10
  Filled 2016-02-09: qty 2
  Filled 2016-02-09 (×5): qty 1
  Filled 2016-02-09: qty 2
  Filled 2016-02-09: qty 1

## 2016-02-09 MED ORDER — IOPAMIDOL (ISOVUE-300) INJECTION 61%
100.0000 mL | Freq: Once | INTRAVENOUS | Status: AC | PRN
  Administered 2016-02-09: 100 mL via INTRAVENOUS

## 2016-02-09 MED ORDER — LISINOPRIL 20 MG PO TABS
20.0000 mg | ORAL_TABLET | Freq: Every day | ORAL | Status: DC
  Filled 2016-02-09: qty 1

## 2016-02-09 MED ORDER — ROSUVASTATIN CALCIUM 20 MG PO TABS
20.0000 mg | ORAL_TABLET | Freq: Every day | ORAL | Status: DC
  Administered 2016-02-10 – 2016-02-14 (×5): 20 mg via ORAL
  Filled 2016-02-09 (×7): qty 1

## 2016-02-09 MED ORDER — HEPARIN SODIUM (PORCINE) 5000 UNIT/ML IJ SOLN
5000.0000 [IU] | Freq: Three times a day (TID) | INTRAMUSCULAR | Status: DC
  Administered 2016-02-09 – 2016-02-14 (×14): 5000 [IU] via SUBCUTANEOUS
  Filled 2016-02-09 (×11): qty 1
  Filled 2016-02-09: qty 2
  Filled 2016-02-09: qty 1

## 2016-02-09 MED ORDER — ONDANSETRON HCL 4 MG/2ML IJ SOLN
INTRAMUSCULAR | Status: AC
  Administered 2016-02-09: 4 mg via INTRAVENOUS
  Filled 2016-02-09: qty 2

## 2016-02-09 MED ORDER — HYDROMORPHONE HCL 1 MG/ML IJ SOLN
1.0000 mg | INTRAMUSCULAR | Status: DC | PRN
  Administered 2016-02-10: 1 mg via INTRAVENOUS
  Filled 2016-02-09: qty 1

## 2016-02-09 MED ORDER — PANTOPRAZOLE SODIUM 40 MG PO TBEC
40.0000 mg | DELAYED_RELEASE_TABLET | Freq: Two times a day (BID) | ORAL | Status: DC
  Administered 2016-02-10 – 2016-02-14 (×9): 40 mg via ORAL
  Filled 2016-02-09 (×9): qty 1

## 2016-02-09 MED ORDER — MORPHINE SULFATE (PF) 4 MG/ML IV SOLN
4.0000 mg | Freq: Once | INTRAVENOUS | Status: AC
  Administered 2016-02-09: 4 mg via INTRAVENOUS

## 2016-02-09 MED ORDER — LISINOPRIL-HYDROCHLOROTHIAZIDE 20-25 MG PO TABS: 1.0000 | ORAL_TABLET | Freq: Every day | ORAL | Status: DC

## 2016-02-09 MED ORDER — DIATRIZOATE MEGLUMINE & SODIUM 66-10 % PO SOLN
15.0000 mL | Freq: Once | ORAL | Status: AC
  Administered 2016-02-09: 15 mL via ORAL

## 2016-02-09 MED ORDER — MORPHINE SULFATE (PF) 4 MG/ML IV SOLN
INTRAVENOUS | Status: AC
  Administered 2016-02-09: 4 mg via INTRAVENOUS
  Filled 2016-02-09: qty 1

## 2016-02-09 MED ORDER — OMEPRAZOLE MAGNESIUM 20 MG PO TBEC: 20.0000 mg | DELAYED_RELEASE_TABLET | Freq: Two times a day (BID) | ORAL | Status: DC

## 2016-02-09 MED ORDER — PIPERACILLIN-TAZOBACTAM 3.375 G IVPB 30 MIN
3.3750 g | Freq: Three times a day (TID) | INTRAVENOUS | Status: DC
  Administered 2016-02-09 – 2016-02-14 (×14): 3.375 g via INTRAVENOUS
  Filled 2016-02-09 (×16): qty 50

## 2016-02-09 NOTE — ED Notes (Signed)
Patient called [facility] this morning who stated he should come over here to be check.  Patient had liver abscess drained last Thursday.  Patient was informed that if the hospital felt it was needed then they would have the on-call surgeon round on him.

## 2016-02-09 NOTE — Progress Notes (Signed)
Patient seen on floor he feels comfortable and has been medicated. IV antibiotics and then started SCDs not yet in place. Discussed with RN. Plan for IV antibiotics being instituted

## 2016-02-09 NOTE — ED Triage Notes (Signed)
Pt states he was here last week and had an abscess on his liver drained that had developed after gall bladder surgery in April, states he was feeling better until yesterday began having increased pain, states he has been running a low grade fever 100..Marland Kitchen

## 2016-02-09 NOTE — ED Notes (Signed)
CT called and informed that the patient is done with his contrast.

## 2016-02-09 NOTE — ED Provider Notes (Addendum)
[facility] Emergency Department Provider Note  ____________________________________________  Time seen: Approximately 12:51 PM  I have reviewed the triage vital signs and the nursing notes.   HISTORY  Chief Complaint Abdominal Pain   HPI Danny DownsSamuel V Klein is a 55 y.o. male history of the obesity, hypertension, hyperlipidemia, cholecystectomy complicated by an intra-abdominal abscess now postop day5 from transcutaneous drainage who presents for evaluation of abdominal pain and chills. Patient reports that the pain started to get worse yesterday in the afternoon. He currently endorses 4 out of 10 pain at rest however with deep inspiration or any movement the pain becomes 8 out of 10, the pain is sharp stabbing quality, located in the right upper quadrant, nonradiating, and associated with nausea, chills, night sweats. Patient denies shortness of breath, chest pain, vomiting, fever, urinary symptoms. He called his surgical team today who recommended he come to the emergency department for evaluation. Patient is currently on Augmentin and has been for the last 2 weeks.  Past Medical History:  Diagnosis Date  . Hypertension     Patient Active Problem List   Diagnosis Date Noted  . Abdominal abscess (HCC) 02/03/2016  . Essential hypertension 01/26/2016  . Hyperlipidemia 01/26/2016  . GERD (gastroesophageal reflux disease) 01/26/2016  . Anxiety 01/26/2016  . Obstructive sleep apnea syndrome 07/31/2014  . Hypogonadism in male 12/17/2013  . Shoulder bursitis 11/04/2013  . Neck pain 11/04/2013  . Disorder of bursae and tendons in shoulder region 11/04/2013  . Pain in joint involving lower leg 03/07/2012  . Thoracic or lumbosacral neuritis or radiculitis 11/04/2011  . 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 Acquired spondylolisthesis 11/04/2011    Past Surgical History:  Procedure Laterality Date  . CARDIAC  CATHETERIZATION  2009  . CHOLECYSTECTOMY    . SPINAL FUSION W/ LUQUE UNIT ROD  2013    Prior to Admission medications   Medication Sig Start Date End Date Taking? Authorizing Provider  amLODipine (NORVASC) 10 MG tablet Take 10 mg by mouth daily.  06/26/15  Yes Historical Provider, MD  amoxicillin-clavulanate (AUGMENTIN) 875-125 MG tablet Take 1 tablet by mouth 2 (two) times daily.   Yes Historical Provider, MD  Cyanocobalamin (RA VITAMIN B-12 TR) 1000 MCG TBCR Take 1 tablet by mouth daily.    Yes Historical Provider, MD  lisinopril-hydrochlorothiazide (PRINZIDE,ZESTORETIC) 20-25 MG tablet Take 1 tablet by mouth daily.    Yes Historical Provider, MD  omeprazole (PRILOSEC OTC) 20 MG tablet Take 20 mg by mouth. 09/07/06  Yes Historical Provider, MD  rosuvastatin (CRESTOR) 20 MG tablet Take by mouth. 12/10/15  Yes Historical Provider, MD    Allergies Hydrocodone and Meperidine  Family History  Problem Relation Age of Onset  . Cancer Mother   . Heart disease Mother   . Heart disease Father     Social History Social History  Substance Use Topics  . Smoking status: Never Smoker  . Smokeless tobacco: Never Used  . Alcohol use 0.6 oz/week    1 Cans of beer per week     Comment: drinks occassionally; 1 can beer per week    Review of Systems  Constitutional: Negative for fever. Eyes: Negative for visual changes. ENT: Negative for sore throat. Cardiovascular: Negative for chest pain. Respiratory: Negative for shortness of breath. Gastrointestinal: + RUQ abdominal pain and nausea. No vomiting or diarrhea. Genitourinary: Negative for dysuria. Musculoskeletal: Negative for back pain. Skin: Negative for rash. Neurological: Negative for headaches, weakness or numbness.  ____________________________________________   PHYSICAL EXAM:  VITAL SIGNS: ED Triage Vitals  Enc Vitals Group     BP 02/09/16 0931 118/62     Pulse Rate 02/09/16 0931 (!) 111     Resp 02/09/16 0931 18     Temp  02/09/16 0931 97.8 F (36.6 C)     Temp Source 02/09/16 0931 Oral     SpO2 02/09/16 0931 97 %     Weight 02/09/16 0931 239 lb (108.4 kg)     Height 02/09/16 0931  (1.778 m)     Head Circumference --      Peak Flow --      Pain Score 02/09/16 0932 4     Pain Loc --      Pain Edu? --      Excl. in GC? --     Constitutional: Alert and oriented. Well appearing and in no apparent distress. HEENT:      Head: Normocephalic and atraumatic.         Eyes: Conjunctivae are normal. Sclera is non-icteric. EOMI. PERRL      Mouth/Throat: Mucous membranes are moist.       Neck: Supple with no signs of meningismus. Cardiovascular: Regular rate and rhythm. No murmurs, gallops, or rubs. 2+ symmetrical distal pulses are present in all extremities. No JVD. Respiratory: Normal respiratory effort. Lungs are clear to auscultation bilaterally. No wheezes, crackles, or rhonchi.  Gastrointestinal: obese, tender to palpation on the RUQ, and non distended with positive bowel sounds. No rebound or guarding. Genitourinary: No CVA tenderness. Musculoskeletal: Nontender with normal range of motion in all extremities. No edema, cyanosis, or erythema of extremities. Neurologic: Normal speech and language. Face is symmetric. Moving all extremities. No gross focal neurologic deficits are appreciated. Skin: Skin is warm, dry and intact. No rash noted. Psychiatric: Mood and affect are normal. Speech and behavior are normal.  ____________________________________________   LABS (all labs ordered are listed, but only abnormal results are displayed)  Labs Reviewed  COMPREHENSIVE METABOLIC PANEL - Abnormal; Notable for the following:       Result Value   Glucose, Bld 100 (*)    Albumin 3.3 (*)    All other components within normal limits  CBC - Abnormal; Notable for the following:    RBC 4.22 (*)    Hemoglobin 12.2 (*)    HCT 35.1 (*)    All other components within normal limits  LIPASE, BLOOD  URINALYSIS  COMPLETEWITH MICROSCOPIC ([facility] ONLY)   ____________________________________________  EKG  none ____________________________________________  RADIOLOGY  CT a/p: Persistent right subhepatic inflammation where small abscess was recently aspirated. No mature collection seen today. 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 ____________________________________________   PROCEDURES  Procedure(s) performed: None Procedures Critical Care performed:  None ____________________________________________   INITIAL IMPRESSION / ASSESSMENT AND PLAN / ED COURSE  55 y.o. male history of the obesity, hypertension, hyperlipidemia, cholecystectomy complicated by an intra-abdominal abscess now postop day5 from transcutaneous drainage who presents for evaluation of abdominal pain and chills. Patient is well-appearing and in no distress, he is tender to palpation on the right upper quadrant and lateral aspect of his abdomen, no rebound or guarding, he is afebrile, initial heart rate was tachycardic 111, labs showing normal WBC. Patient does not meet SIRS criteria at this time. Received morphine  in triage and BP were soft in the low 100s and 90s. Patient was started on IVF. Plan to get CT a/p. Will hold off on abx  at this time.   Clinical Course  Comment By Time  CT showing Persistent right subhepatic inflammation where small abscess was recently aspirated. No mature collection seen today. 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  I spoke with Dr. Excell Seltzer who will come down to evaluate the patient. Nita Sickle, MD 08/08 1339   Patient admitted to surgery service for IV abx.  Pertinent labs & imaging results that were available during my care of the patient were reviewed by me and considered in my medical decision making (see chart for details).    ____________________________________________   FINAL CLINICAL IMPRESSION(S) /  ED DIAGNOSES  Abdominal pain Intra-abdominal infection   NEW MEDICATIONS STARTED DURING THIS VISIT:  Current Discharge Medication List       Note:  This document was prepared using Dragon voice recognition software and may include unintentional dictation errors.    Nita Sickle, MD 02/09/16 1608    Nita Sickle, MD 02/09/16 [phone]

## 2016-02-09 NOTE — Telephone Encounter (Signed)
Patient called stating that he was admitted to the hospital last week from 8/2-10/2015 for abdominal pain. Now patient is experiencing abdominal pain (4 out of 5 resting) and (8 out of 9 when he gets up). He also stated that he can not take a deep breath without hurting. Due to this patient was recommended to go back to the ED. Patient agreed and stated that he would go.

## 2016-02-09 NOTE — H&P (Signed)
Danny Klein is an 55 y.o. male.    Chief Complaint: Right flank pain  HPI: This is a 55 year old male patient with a very, located history. He is known to me from prior office visit and to our surgical service for other office visits. He had a very complicated laparoscopic cholecystectomy performed for severe acute cholecystitis back in March or April of this year at [facility]. In the time immediately postoperatively he developed some flank pain and had studies that showed some inflammation near the tip of Danny liver but no obvious bile leak there was some fluid present but never required any sort of drainage procedure. He did well after that and then in the last month or so he's had return of Danny flank pain with worsening pain causing him some nausea but no emesis he is had normal bowel movements she's had no fevers or chills but does not feel well and multiple studies have shown a phlegmon in the right flank area involving the tip of the liver. There is no obvious bile leak. Danny liver function tests and white blood cell count have been fairly normal not to suggest any sign of a bile leak. He also had an MRI in the immediate postoperative period and failed to show identify or confirm any sort of bile leak.  He comes to the emergency room today and ongoing pain and had a CT scan showing no drainable fluid collection. He did have an aspiration performed last week which the fluid did not grow any organism and no drain tube was left in place.  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 Hypertension     Past Surgical History:  Procedure Laterality Date  . CARDIAC CATHETERIZATION  2009  . CHOLECYSTECTOMY    . SPINAL FUSION W/ LUQUE UNIT ROD  2013    Family History  Problem Relation Age of Onset  . Cancer Mother   . Heart disease Mother   . Heart disease Father     Social History:  reports that he has never smoked. He has never used smokeless tobacco. He reports that he drinks about 0.6 oz of alcohol per week . He reports that he does not use drugs.  Allergies:  Allergies  Allergen Reactions  . Hydrocodone Itching and Other (See Comments)  . Meperidine Other (See Comments)    Nausea and vomiting , talking out of head      (Not in a hospital admission)   Review of Systems  Constitutional: Positive for malaise/fatigue. Negative for chills, fever and weight loss.  HENT: Negative.   Eyes: Negative.   Respiratory: Negative for cough, hemoptysis and sputum production.   Cardiovascular: Negative for chest pain, palpitations and orthopnea.  Gastrointestinal: Positive for abdominal pain and nausea. Negative for blood in stool, constipation, diarrhea, heartburn, melena and vomiting.  Genitourinary: Negative.   Musculoskeletal: Negative.   Skin: Negative.   Neurological: Positive for weakness.  Endo/Heme/Allergies: Negative.   Psychiatric/Behavioral: Negative.      Physical Exam:  BP (!) 104/57   Pulse 86   Temp 97.8 F (36.6 C) (Oral)   Resp 14   Ht _0  (1.778 m)   Wt 239 lb (108.4 kg)   SpO2 95%   BMI 34.29 kg/m   Physical Exam  Constitutional: He is oriented to person, place, and time and well-developed, well-nourished, and  in no distress. No distress.  Uncomfortable-appearing male patient  HENT:  Head: Normocephalic and atraumatic.  Eyes: Pupils are equal, round, and reactive to light. Right eye exhibits no discharge. Left eye exhibits no discharge. No scleral icterus.  Neck: Normal range of motion.  Cardiovascular: Normal rate, regular rhythm and normal heart sounds.   Pulmonary/Chest: Effort normal and breath sounds normal. No respiratory distress. He has no wheezes. He has no rales. He exhibits no tenderness.  Abdominal: Soft. He exhibits no distension and no mass. There is tenderness. There is no rebound and no guarding.   Tenderness in the right lateral upper quadrant and right flank no obvious drain site and no erythema  Musculoskeletal: Normal range of motion. He exhibits no edema or tenderness.  Lymphadenopathy:    He has no cervical adenopathy.  Neurological: He is alert and oriented to person, place, and time.  Skin: Skin is warm and dry. No rash noted. He is not diaphoretic. No erythema.  Psychiatric: Mood and affect normal.  Vitals reviewed.       Results for orders placed or performed during the hospital encounter of 02/09/16 (from the past 48 hour(s))  Lipase, blood     Status: None   Collection Time: 02/09/16  9:40 AM  Result Value Ref Range   Lipase 17 11 - 51 U/L  Comprehensive metabolic panel     Status: Abnormal   Collection Time: 02/09/16  9:40 AM  Result Value Ref Range   Sodium 136 135 - 145 mmol/L   Potassium 4.0 3.5 - 5.1 mmol/L   Chloride 103 101 - 111 mmol/L   CO2 23 22 - 32 mmol/L   Glucose, Bld 100 (H) 65 - 99 mg/dL   BUN 16 6 - 20 mg/dL   Creatinine, Ser 0.83 0.61 - 1.24 mg/dL   Calcium 9.4 8.9 - 10.3 mg/dL   Total Protein 7.8 6.5 - 8.1 g/dL   Albumin 3.3 (L) 3.5 - 5.0 g/dL   AST 26 15 - 41 U/L   ALT 33 17 - 63 U/L   Alkaline Phosphatase 88 38 - 126 U/L   Total Bilirubin 0.7 0.3 - 1.2 mg/dL   GFR calc non Af Amer >60 >60 mL/min   GFR calc Af Amer >60 >60 mL/min    Comment: (NOTE) The eGFR has been calculated using the CKD EPI equation. This calculation has not been validated in all clinical situations. eGFR's persistently <60 mL/min signify possible Chronic Kidney Disease.    Anion gap 10 5 - 15  CBC     Status: Abnormal   Collection Time: 02/09/16  9:40 AM  Result Value Ref Range   WBC 8.0 3.8 - 10.6 K/uL   RBC 4.22 (L) 4.40 - 5.90 MIL/uL   Hemoglobin 12.2 (L) 13.0 - 18.0 g/dL   HCT 35.1 (L) 40.0 - 52.0 %   MCV 83.3 80.0 - 100.0 fL   MCH 28.9 26.0 - 34.0 pg   MCHC 34.7 32.0 - 36.0 g/dL   RDW 14.1 11.5 - 14.5 %   Platelets 418 150 - 440 K/uL   Ct  Abdomen Pelvis W Contrast  Result Date: 02/09/2016 CLINICAL DATA:  Abdominal pain.  Status post liver abscess drainage. EXAM: CT ABDOMEN AND PELVIS WITH CONTRAST TECHNIQUE: Multidetector CT imaging of the abdomen and pelvis was performed using the standard protocol following bolus administration of intravenous contrast. CONTRAST:  141m ISOVUE-300 IOPAMIDOL (ISOVUE-300) INJECTION 61% COMPARISON:  01/29/2016 FINDINGS: Lower chest and abdominal wall: Fatty enlargement  inguinal canals which may be hernia. Chronic cardiomegaly.  Mild basilar atelectasis Hepatobiliary: Persistent ill-defined soft tissue inferior to the right liver tip with wispy appearance and probable enhancement but no rim enhancing fluid collection for drainage. Adjacent inferior tip of the liver appears edematous.Cholecystectomy. No recurrence of fluid collection in the gallbladder fossa where there was rim enhancing collection 10/29/2015. Pancreas: Unremarkable. Spleen: Unremarkable. Adrenals/Urinary Tract: Negative adrenals. Postoperative changes around the lower pole left kidney are stable. No hydronephrosis or stone. Otherwise symmetric renal enhancement. Unremarkable bladder. Stomach/Bowel: No obstruction. No appendicitis. Distal colonic diverticulosis. Reproductive:No pathologic findings. Vascular/Lymphatic: No acute vascular abnormality. No mass or adenopathy. Other: No ascites or pneumoperitoneum. Musculoskeletal: No acute abnormalities. Disc degeneration. L4-5 posterior lateral solid bony fusion. IMPRESSION: Persistent right subhepatic inflammation where small abscess was recently aspirated. No mature collection seen today. 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 Electronically Signed   By: Monte Fantasia M.D.   On: 02/09/2016 13:25     Assessment/Plan  This patient well-known to me from prior visits in the office who had a complicated laparoscopic cholecystectomy for severe cholecystitis back in  April postoperatively he developed pain which resolved and then the pain returned and there been signs on multiple CT scans of a phlegmon in the right lower quadrant recently had a miniscule amount of fluid aspirated which failed to grow any identifiable organisms. All of these studies have been's compared and observed personally. An over 1-1/2 hours was spent reviewing this patient's workup.  This is a very frustrating situation in that the patient's very frustrated but there is no easy here to this condition as there is no obvious drainable abscess. He has not had an adequate trial of IV antibiotics which may improve Danny situation. My plan and as this is one of the options it was discussed with B2 admitted into the hospital and should he have improvement on IV Zosyn and then conversion to IV antibiotics at home with a PICC line could be entertained. However with this, K situation and may not improve immediately and he may require either surgery or transfer to [facility] for surgery: Evaluation. He does not want to go back to [facility].  I reminded him that this would be a very difficult process and that it our attempts at helping him may not result in improved symptomatology and he may require transfer to [facility] or another qualified facility. I also discussed that I would not attempt to do and expiration with a laparoscope is in my experience this areas so far posterior that it would not be amenable or accessible via the laparoscope and that only a large open incision and procedure with mobilization of the right colon would suffice. And I would only do this if Danny IV antibiotic therapy failed and transfer was not an option. He is Klein understood this we answered many questions together and will attempt IV antibiotics for 4872 hours.  Florene Glen, MD, FACS

## 2016-02-09 NOTE — ED Notes (Signed)
Patient returned from radiology

## 2016-02-10 DIAGNOSIS — B999 Unspecified infectious disease: Secondary | ICD-10-CM

## 2016-02-10 DIAGNOSIS — R1011 Right upper quadrant pain: Secondary | ICD-10-CM

## 2016-02-10 LAB — CBC
HCT: 31.9 % — ABNORMAL LOW (ref 40.0–52.0)
Hemoglobin: 10.8 g/dL — ABNORMAL LOW (ref 13.0–18.0)
MCH: 28.5 pg (ref 26.0–34.0)
MCHC: 33.7 g/dL (ref 32.0–36.0)
MCV: 84.4 fL (ref 80.0–100.0)
PLATELETS: 364 10*3/uL (ref 150–440)
RBC: 3.78 MIL/uL — AB (ref 4.40–5.90)
RDW: 14 % (ref 11.5–14.5)
WBC: 7.9 10*3/uL (ref 3.8–10.6)

## 2016-02-10 LAB — URINALYSIS COMPLETE WITH MICROSCOPIC (ARMC ONLY)
Bacteria, UA: NONE SEEN
Bilirubin Urine: NEGATIVE
GLUCOSE, UA: NEGATIVE mg/dL
HGB URINE DIPSTICK: NEGATIVE
Ketones, ur: NEGATIVE mg/dL
Leukocytes, UA: NEGATIVE
NITRITE: NEGATIVE
PROTEIN: NEGATIVE mg/dL
RBC / HPF: NONE SEEN RBC/hpf (ref 0–5)
SPECIFIC GRAVITY, URINE: 1.009 (ref 1.005–1.030)
Squamous Epithelial / LPF: NONE SEEN
pH: 6 (ref 5.0–8.0)

## 2016-02-10 LAB — GLUCOSE, CAPILLARY: Glucose-Capillary: 111 mg/dL — ABNORMAL HIGH (ref 65–99)

## 2016-02-10 MED ORDER — BOOST / RESOURCE BREEZE PO LIQD: 1.0000 | Freq: Two times a day (BID) | ORAL | Status: DC

## 2016-02-10 NOTE — Progress Notes (Signed)
Initial Nutrition Assessment  DOCUMENTATION CODES:   Obesity unspecified  INTERVENTION:   -Boost Breeze po BID, each supplement provides 250 kcal and 9 grams of protein  NUTRITION DIAGNOSIS:   Inadequate oral intake related to poor appetite (variable intake) as evidenced by per patient/family report.  GOAL:   Patient will meet greater than or equal to 90% of their needs  MONITOR:   PO intake, Supplement acceptance, Labs, Weight trends, Skin, I & O's  REASON FOR ASSESSMENT:   Malnutrition Screening Tool    ASSESSMENT:   Pt with hx of complicated laparoscopic cholecystectomy for severe cholecystitis back in April postoperatively he developed pain which resolved and then the pain returned and there been signs on multiple CT scans of a phlegmon in the right lower quadrant recently had a miniscule amount of fluid aspirated which failed to grow any identifiable organisms.  Pt admitted with rt flank pain s/p lap chole. IV abx has been initiated.   Spoke with pt at bedside. He reports that he has experienced a general decline in health over the past 1-2 months since undergoing lap chole, due to complications and multiple hospitalizations. Pt reports that when he is feeling well, he generally consumes 2 meals per day (he never eats lunch out of habit). Pt reports intake is variable from day to day due to pain. He reports he appetite has improved in general over the past few weeks and over the weekend was the first time he felt like eating. Noted 100% meal completion at breakfast  Pt reports his UBW is around 300#, however, wt loss is a combination of lifestyle interventions (cutting back on sweets and alcoholic beverages) as well as poor intake. He reports he has tried Boost supplements in the past, but did not tolerate them well ("they ran right through me"). He expressed understanding of increased protein intake. He is amenable to Boost Breeze supplements.   Nutrition-Focused physical  exam completed. Findings are no fat depletion, no muscle depletion, and no edema.   Labs reviewed.   Diet Order:  Diet Heart Room service appropriate? Yes; Fluid consistency: Thin  Skin:  Reviewed, no issues  Last BM:  02/09/16  Height:   Ht Readings from Last 1 Encounters:  02/09/16 5\' 10"  (1.778 m)    Weight:   Wt Readings from Last 1 Encounters:  02/09/16 239 lb (108.4 kg)    Ideal Body Weight:  75.5 kg  BMI:  Body mass index is 34.29 kg/m.  Estimated Nutritional Needs:   Kcal:  1900-2100  Protein:  100-115 grams  Fluid:  1.9-2.1 L  EDUCATION NEEDS:   Education needs addressed  Tamber Burtch A. Mayford KnifeWilliams, RD, LDN, CDE Pager: [phone] After hours Pager: [phone]

## 2016-02-10 NOTE — Progress Notes (Signed)
CC: Right flank pain Subjective: This a patient with a phlegmon in the right flank following a laparoscopic cholecystectomy. This is been long-standing and he feels a little bit better this morning but almost the same. Denies nausea vomiting no fevers or chills.  Objective: Vital signs in last 24 hours: Temp:  [97.8 F (36.6 C)-98.3 F (36.8 C)] 98.2 F (36.8 C) (08/09 0516) Pulse Rate:  [72-111] 79 (08/09 0516) Resp:  [14-23] 16 (08/08 2144) BP: (94-127)/(48-73) 102/50 (08/09 0516) SpO2:  [95 %-100 %] 95 % (08/09 0516) Weight:  [239 lb (108.4 kg)] 239 lb (108.4 kg) (08/08 0931) Last BM Date: 02/09/16  Intake/Output from previous day: 08/08 0701 - 08/09 0700 In: 492 [P.O.:60; I.V.:382; IV Piggyback:50] Out: 500 [Urine:500] Intake/Output this shift: Total I/O In: -  Out: 350 [Urine:350]  Physical exam:  Awake alert oriented no icterus no jaundice abdomen is soft and minimally tender in the right lower quadrant without peritoneal signs some right flank tenderness nontender calves  Lab Results: CBC   Recent Labs  02/09/16 0940 02/10/16 0518  WBC 8.0 7.9  HGB 12.2* 10.8*  HCT 35.1* 31.9*  PLT 418 364   BMET  Recent Labs  02/09/16 0940  NA 136  K 4.0  CL 103  CO2 23  GLUCOSE 100*  BUN 16  CREATININE 0.83  CALCIUM 9.4   PT/INR No results for input(s): LABPROT, INR in the last 72 hours. ABG No results for input(s): PHART, HCO3 in the last 72 hours.  Invalid input(s): PCO2, PO2  Studies/Results: Ct Abdomen Pelvis W Contrast  Result Date: 02/09/2016 CLINICAL DATA:  Abdominal pain.  Status post liver abscess drainage. EXAM: CT ABDOMEN AND PELVIS WITH CONTRAST TECHNIQUE: Multidetector CT imaging of the abdomen and pelvis was performed using the standard protocol following bolus administration of intravenous contrast. CONTRAST:  ISOVUE-300 IOPAMIDOL (ISOVUE-300) INJECTION 61% COMPARISON:  01/29/2016 FINDINGS: Lower chest and abdominal wall: Fatty enlargement  inguinal canals which may be hernia. Chronic cardiomegaly.  Mild basilar atelectasis Hepatobiliary: Persistent ill-defined soft tissue inferior to the right liver tip with wispy appearance and probable enhancement but no rim enhancing fluid collection for drainage. Adjacent inferior tip of the liver appears edematous.Cholecystectomy. No recurrence of fluid collection in the gallbladder fossa where there was rim enhancing collection 10/29/2015. Pancreas: Unremarkable. Spleen: Unremarkable. Adrenals/Urinary Tract: Negative adrenals. Postoperative changes around the lower pole left kidney are stable. No hydronephrosis or stone. Otherwise symmetric renal enhancement. Unremarkable bladder. Stomach/Bowel: No obstruction. No appendicitis. Distal colonic diverticulosis. Reproductive:No pathologic findings. Vascular/Lymphatic: No acute vascular abnormality. No mass or adenopathy. Other: No ascites or pneumoperitoneum. Musculoskeletal: No acute abnormalities. Disc degeneration. L4-5 posterior lateral solid bony fusion. IMPRESSION: Persistent right subhepatic inflammation where small abscess was recently aspirated. No mature collection seen today. 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 Electronically Signed   By: Marnee Spring M.D.   On: 02/09/2016 13:25    Anti-infectives: Anti-infectives    Start     Dose/Rate Route Frequency Ordered Stop   02/09/16 1500  piperacillin-tazobactam (ZOSYN) IVPB 3.375 g     3.375 g 12.5 mL/hr over 240 Minutes Intravenous Every 8 hours 02/09/16 1445     02/09/16 1500  vancomycin (VANCOCIN) IVPB 1000 mg/200 mL premix     1,000 mg 200 mL/hr over 60 Minutes Intravenous  Once 02/09/16 1445 02/09/16 1735      Assessment/Plan:  White blood cell count remains normal and exam is essentially unchanged. Recommend continuing IV antibiotics as we  discussed yesterday.  Lattie Hawichard E Carlito Bogert, MD, FACS  02/10/2016

## 2016-02-11 ENCOUNTER — Encounter: Payer: BC Managed Care – PPO | Admitting: General Surgery

## 2016-02-11 NOTE — Progress Notes (Signed)
CC: Right flank phlegmon Subjective: This patient with a right flank phlegmon following [facility] there is no sign of drainable fluid collection today he complains of ongoing right flank pain with no worsening and but no improvement on IV antibiotics. He's been on the IV antibiotics approximately 36 hours. No nausea or vomiting no fevers or chills  Objective: Vital signs in last 24 hours: Temp:  [97.8 F (36.6 C)-98.7 F (37.1 C)] 98.7 F (37.1 C) (08/10 0505) Pulse Rate:  [75-81] 81 (08/10 0505) Resp:  [17-18] 17 (08/10 0505) BP: (113-120)/(41-50) 113/50 (08/10 0505) SpO2:  [95 %-98 %] 95 % (08/10 0505) Last BM Date: 02/11/16  Intake/Output from previous day: 08/09 0701 - 08/10 0700 In: 5037 [P.O.:240; I.V.:4595; IV Piggyback:202] Out: 4350 [Urine:4350] Intake/Output this shift: Total I/O In: 344 [I.V.:344] Out: 500 [Urine:500]  Physical exam:  Vital signs are stable and reviewed no fevers Up and walking alert oriented Soft abdomen tender in the right upper quadrant tenderness right flank without peritoneal signs nontender calves no jaundice no icterus  Lab Results: CBC   Recent Labs  02/09/16 0940 02/10/16 0518  WBC 8.0 7.9  HGB 12.2* 10.8*  HCT 35.1* 31.9*  PLT 418 364   BMET  Recent Labs  02/09/16 0940  NA 136  K 4.0  CL 103  CO2 23  GLUCOSE 100*  BUN 16  CREATININE 0.83  CALCIUM 9.4   PT/INR No results for input(s): LABPROT, INR in the last 72 hours. ABG No results for input(s): PHART, HCO3 in the last 72 hours.  Invalid input(s): PCO2, PO2  Studies/Results: Ct Abdomen Pelvis W Contrast  Result Date: 02/09/2016 CLINICAL DATA:  Abdominal pain.  Status post liver abscess drainage. EXAM: CT ABDOMEN AND PELVIS WITH CONTRAST TECHNIQUE: Multidetector CT imaging of the abdomen and pelvis was performed using the standard protocol following bolus administration of intravenous contrast. CONTRAST:   100mL ISOVUE-300 IOPAMIDOL (ISOVUE-300) INJECTION 61% COMPARISON:  01/29/2016 FINDINGS: Lower chest and abdominal wall: Fatty enlargement inguinal canals which may be hernia. Chronic cardiomegaly.  Mild basilar atelectasis Hepatobiliary: Persistent ill-defined soft tissue inferior to the right liver tip with wispy appearance and probable enhancement but no rim enhancing fluid collection for drainage. Adjacent inferior tip of the liver appears edematous.Cholecystectomy. No recurrence of fluid collection in the gallbladder fossa where there was rim enhancing collection 10/29/2015. Pancreas: Unremarkable. Spleen: Unremarkable. Adrenals/Urinary Tract: Negative adrenals. Postoperative changes around the lower pole left kidney are stable. No hydronephrosis or stone. Otherwise symmetric renal enhancement. Unremarkable bladder. Stomach/Bowel: No obstruction. No appendicitis. Distal colonic diverticulosis. Reproductive:No pathologic findings. Vascular/Lymphatic: No acute vascular abnormality. No mass or adenopathy. Other: No ascites or pneumoperitoneum. Musculoskeletal: No acute abnormalities. Disc degeneration. L4-5 posterior lateral solid bony fusion. IMPRESSION: Persistent right subhepatic inflammation where small abscess was recently aspirated. No mature collection seen today. 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 Electronically Signed   By: Marnee SpringJonathon  Watts M.D.   On: 02/09/2016 13:25    Anti-infectives: Anti-infectives    Start     Dose/Rate Route Frequency Ordered Stop   02/09/16 1500  piperacillin-tazobactam (ZOSYN) IVPB 3.375 g     3.375 g 12.5 mL/hr over 240 Minutes Intravenous Every 8 hours 02/09/16 1445     02/09/16 1500  vancomycin (VANCOCIN) IVPB 1000 mg/200 mL premix     1,000 mg 200 mL/hr over 60 Minutes Intravenous  Once 02/09/16 1445 02/09/16 1735      Assessment/Plan:  Patient not  showing any sign of improvement with 36 hours of IV antibiotics hopefully  the next 24 hours will show some improvement I discussed with him the plan that should this not improve then continuing oral antibiotics as an outpatient following up at [facility] or [facility] would be a consideration he does not want to go back to [facility] at this point. If he improves however I would place a PICC line and consider outpatient IV antibiotics but only if he improves significantly on the IVs at this time.  Lattie Haw, MD, FACS  02/11/2016

## 2016-02-12 ENCOUNTER — Inpatient Hospital Stay: Payer: BC Managed Care – PPO

## 2016-02-12 HISTORY — PX: PICC LINE PLACE PERIPHERAL (ARMC HX): HXRAD1248

## 2016-02-12 LAB — CBC WITH DIFFERENTIAL/PLATELET
BASOS PCT: 0 %
Basophils Absolute: 0 10*3/uL (ref 0–0.1)
EOS ABS: 0.2 10*3/uL (ref 0–0.7)
Eosinophils Relative: 3 %
HCT: 30.9 % — ABNORMAL LOW (ref 40.0–52.0)
HEMOGLOBIN: 10.5 g/dL — AB (ref 13.0–18.0)
LYMPHS ABS: 0.8 10*3/uL — AB (ref 1.0–3.6)
Lymphocytes Relative: 14 %
MCH: 28.7 pg (ref 26.0–34.0)
MCHC: 34.1 g/dL (ref 32.0–36.0)
MCV: 84.2 fL (ref 80.0–100.0)
MONO ABS: 0.7 10*3/uL (ref 0.2–1.0)
MONOS PCT: 11 %
NEUTROS PCT: 72 %
Neutro Abs: 4.4 10*3/uL (ref 1.4–6.5)
Platelets: 364 10*3/uL (ref 150–440)
RBC: 3.66 MIL/uL — ABNORMAL LOW (ref 4.40–5.90)
RDW: 14 % (ref 11.5–14.5)
WBC: 6 10*3/uL (ref 3.8–10.6)

## 2016-02-12 NOTE — Clinical Social Work Note (Signed)
CSW received consult requesting arranging home IV ABX for discharge anticipated for Sunday or Monday. CSW has deferred this to the RN CM who will make these arrangements. York SpanielMonica Nawaal Alling MSW,LCSW [phone]

## 2016-02-12 NOTE — Progress Notes (Signed)
CC: Right flank pain Subjective: This a patient with a right flank phlegmon following laparoscopic cholecystectomy. Patient feels better today for the first time in a long time he states his pain is old 4 and it had been in a he's feeling better overall tolerating a diet no fevers or chills no nausea or vomiting  Objective: Vital signs in last 24 hours: Temp:  [98.4 F (36.9 C)-99.1 F (37.3 C)] 98.4 F (36.9 C) (08/11 0419) Pulse Rate:  [74-85] 78 (08/11 0419) Resp:  [18] 18 (08/11 0419) BP: (99-120)/(47-54) 114/47 (08/11 0419) SpO2:  [97 %-98 %] 98 % (08/11 0419) Last BM Date: 02/11/16  Intake/Output from previous day: 08/10 0701 - 08/11 0700 In: 1107.8 [P.O.:360; I.V.:547.8; IV Piggyback:200] Out: 2500 [Urine:2500] Intake/Output this shift: Total I/O In: -  Out: 500 [Urine:500]  Physical exam:  Awake alert oriented vital signs are stable afebrile abdomen is soft and much less tender in the right upper quadrant and right flank nontender calves no icterus no jaundice  Labs are reviewed  Lab Results: CBC   Recent Labs  02/10/16 0518 02/12/16 0436  WBC 7.9 6.0  HGB 10.8* 10.5*  HCT 31.9* 30.9*  PLT 364 364   BMET  Recent Labs  02/09/16 0940  NA 136  K 4.0  CL 103  CO2 23  GLUCOSE 100*  BUN 16  CREATININE 0.83  CALCIUM 9.4   PT/INR No results for input(s): LABPROT, INR in the last 72 hours. ABG No results for input(s): PHART, HCO3 in the last 72 hours.  Invalid input(s): PCO2, PO2  Studies/Results: No results found.  Anti-infectives: Anti-infectives    Start     Dose/Rate Route Frequency Ordered Stop   02/09/16 1500  piperacillin-tazobactam (ZOSYN) IVPB 3.375 g     3.375 g 12.5 mL/hr over 240 Minutes Intravenous Every 8 hours 02/09/16 1445     02/09/16 1500  vancomycin (VANCOCIN) IVPB 1000 mg/200 mL premix     1,000 mg 200 mL/hr over 60 Minutes Intravenous  Once 02/09/16 1445 02/09/16 1735      Assessment/Plan:  Patient doing much better  and this is the first time he's had any sort of improvement in the last few weeks. We will place PICC line today and ask social services to see the patient concerning IV antibiotic therapy for 2 weeks at home. Also prior to discharge I will attempt to change his IV antibiotics to a every 12 hours every 24 drug but I will not do that today as he is showing improvement now and I would not want to alter that path of improvement.  Lattie Hawichard E Quida Glasser, MD, FACS  02/12/2016

## 2016-02-12 NOTE — Care Management Note (Signed)
Case Management Note  Patient Details  Name: Danny Klein MRN: [id number] Date of Birth: 09/30/1960  Subjective/Objective:                 Patient admitted with phlegmon in the right lower quadrant status post surgery.  Patient lives at home with his wife. PCP = FELDPAUSCH.  [place] [facility] [place].  No issues with obtaining medications or transportation.  No medical equipment or previous home health.  Patient for PICC line placement today.  Per Dr. Excell Seltzerooper anticipate that patient will discharge home with IV antibiotics on Sunday or Monday.  It is not clear which antibiotic patient will be discharging on.  Patient was presented with agency preference list.  [facility]  [facility]    Action/Plan: Insurance rep has left message to notify that Berkley Harveyauth has been received for home IV antibiotics.  Should she need to be contacted [phone].   Expected Discharge Date:                  Expected Discharge Plan:     In-House Referral:     Discharge planning Services     Post Acute Care Choice:    Choice offered to:     DME Arranged:    DME Agency:     HH Arranged:    HH Agency:     Status of Service:     If discussed at [employer] Length of [employer] Meetings, dates discussed:    Additional Comments:  Chapman FitchBOWEN, Aundra Pung T, RN 02/12/2016, 11:01 AM

## 2016-02-13 NOTE — Progress Notes (Signed)
CC: Right flank pain Subjective: Patient states that his right flank pain and abdominal pain is much improved even better today than it was yesterday. He has no nausea vomiting no fevers or chills.  Objective: Vital signs in last 24 hours: Temp:  [97.9 F (36.6 C)-98.8 F (37.1 C)] 97.9 F (36.6 C) (08/12 0521) Pulse Rate:  [74-79] 79 (08/12 0521) Resp:  [18-20] 20 (08/12 0521) BP: (96-118)/(37-58) 98/51 (08/12 0521) SpO2:  [96 %-100 %] 96 % (08/12 0521) Last BM Date: 02/12/16  Intake/Output from previous day: 08/11 0701 - 08/12 0700 In: 240 [P.O.:240] Out: 1350 [Urine:1350] Intake/Output this shift: No intake/output data recorded.  Physical exam:  Awake alert oriented vital signs are stable he's afebrile abdomen is soft and much less tender if at all in the right upper quadrant no peritoneal signs no flank tenderness Calves are nontender No icterus no jaundice  Lab Results: CBC   Recent Labs  02/12/16 0436  WBC 6.0  HGB 10.5*  HCT 30.9*  PLT 364   BMET No results for input(s): NA, K, CL, CO2, GLUCOSE, BUN, CREATININE, CALCIUM in the last 72 hours. PT/INR No results for input(s): LABPROT, INR in the last 72 hours. ABG No results for input(s): PHART, HCO3 in the last 72 hours.  Invalid input(s): PCO2, PO2  Studies/Results: Dg Chest Port 1 View  Result Date: 02/12/2016 CLINICAL DATA:  Check PICC line placement EXAM: PORTABLE CHEST 1 VIEW COMPARISON:  11/22/2005 FINDINGS: Cardiac shadow is mildly enlarged. A left-sided PICC line is noted with catheter tip at the cavoatrial junction in satisfactory position. The lungs are clear. No bony abnormality is noted. IMPRESSION: PICC line in satisfactory position. Electronically Signed   By: Alcide CleverMark  Lukens M.D.   On: 02/12/2016 11:49    Anti-infectives: Anti-infectives    Start     Dose/Rate Route Frequency Ordered Stop   02/09/16 1500  piperacillin-tazobactam (ZOSYN) IVPB 3.375 g     3.375 g 12.5 mL/hr over 240 Minutes  Intravenous Every 8 hours 02/09/16 1445     02/09/16 1500  vancomycin (VANCOCIN) IVPB 1000 mg/200 mL premix     1,000 mg 200 mL/hr over 60 Minutes Intravenous  Once 02/09/16 1445 02/09/16 1735      Assessment/Plan:  Doing well PICC line was placed yesterday we will change antibiotics to acute 24 drug tomorrow and likely discharge tomorrow with pick and IV antibiotics.  Lattie Hawichard E Dolly Harbach, MD, FACS  02/13/2016

## 2016-02-14 MED ORDER — OXYCODONE HCL 5 MG PO TABS: 5.0000 mg | ORAL_TABLET | ORAL | 0 refills | Status: DC | PRN

## 2016-02-14 MED ORDER — LEVOFLOXACIN IN D5W 750 MG/150ML IV SOLN: 750.0000 mg | INTRAVENOUS | 1 refills | Status: DC

## 2016-02-14 MED ORDER — PANTOPRAZOLE SODIUM 40 MG PO TBEC: 40.0000 mg | DELAYED_RELEASE_TABLET | Freq: Every day | ORAL | 1 refills | Status: DC

## 2016-02-14 MED ORDER — LEVOFLOXACIN IN D5W 750 MG/150ML IV SOLN
750.0000 mg | INTRAVENOUS | Status: DC
  Administered 2016-02-14: 750 mg via INTRAVENOUS
  Filled 2016-02-14: qty 150

## 2016-02-14 NOTE — Progress Notes (Signed)
CC: Right flank pain Subjective: This patient with a right flank phlegmon [facility]. He is improving every day on IV antibiotics and a PICC line has been placed. He feels much better today  Objective: Vital signs in last 24 hours: Temp:  [98 F (36.7 C)-98.3 F (36.8 C)] 98 F (36.7 C) (08/13 0414) Pulse Rate:  [60-77] 77 (08/13 0837) Resp:  [18-20] 20 (08/13 0414) BP: (92-111)/(41-64) 111/64 (08/13 0837) SpO2:  [96 %-98 %] 98 % (08/13 0414) Last BM Date: 02/13/16  Intake/Output from previous day: 08/12 0701 - 08/13 0700 In: 0  Out: 925 [Urine:925] Intake/Output this shift: No intake/output data recorded.  Physical exam:  Awake alert oriented abdomen is soft nontender vital signs are stable and afebrile  Lab Results: CBC   Recent Labs  02/12/16 0436  WBC 6.0  HGB 10.5*  HCT 30.9*  PLT 364   BMET No results for input(s): NA, K, CL, CO2, GLUCOSE, BUN, CREATININE, CALCIUM in the last 72 hours. PT/INR No results for input(s): LABPROT, INR in the last 72 hours. ABG No results for input(s): PHART, HCO3 in the last 72 hours.  Invalid input(s): PCO2, PO2  Studies/Results: Dg Chest Port 1 View  Result Date: 02/12/2016 CLINICAL DATA:  Check PICC line placement EXAM: PORTABLE CHEST 1 VIEW COMPARISON:  11/22/2005 FINDINGS: Cardiac shadow is mildly enlarged. A left-sided PICC line is noted with catheter tip at the cavoatrial junction in satisfactory position. The lungs are clear. No bony abnormality is noted. IMPRESSION: PICC line in satisfactory position. Electronically Signed   By: Alcide CleverMark  Lukens M.D.   On: 02/12/2016 11:49    Anti-infectives: Anti-infectives    Start     Dose/Rate Route Frequency Ordered Stop   02/14/16 0830  levofloxacin (LEVAQUIN) IVPB 750 mg     750 mg 100 mL/hr over 90 Minutes Intravenous Every 24 hours 02/14/16 0734     02/14/16 0000  levofloxacin (LEVAQUIN) 750 MG/150ML SOLN     750  mg 100 mL/hr over 90 Minutes Intravenous Every 24 hours 02/14/16 0737     02/09/16 1500  piperacillin-tazobactam (ZOSYN) IVPB 3.375 g  Status:  Discontinued     3.375 g 12.5 mL/hr over 240 Minutes Intravenous Every 8 hours 02/09/16 1445 02/14/16 0734   02/09/16 1500  vancomycin (VANCOCIN) IVPB 1000 mg/200 mL premix     1,000 mg 200 mL/hr over 60 Minutes Intravenous  Once 02/09/16 1445 02/09/16 1735      Assessment/Plan:  Patient is much improved on IV antibiotics with a PICC line in place. I will switch from Zosyn to a every 24 drug, Levaquin and plan discharge today if home health can be arranged otherwise will be done tomorrow prescriptions are in the chart and he will follow-up in my office midweek.  Lattie Hawichard E Dyke Weible, MD, FACS  02/14/2016

## 2016-02-14 NOTE — Discharge Instructions (Signed)
Regular diet Resume normal activity Take Levaquin once a day IV via the PICC line Follow-up with Dr. Excell Seltzerooper in 6 days May shower

## 2016-02-14 NOTE — Discharge Summary (Signed)
Physician Discharge Summary  Patient ID: Danny Klein MRN: [id number] DOB/AGE: 12/09/1960 55 y.o.  Admit date: 02/09/2016 Discharge date: 02/14/2016   Discharge Diagnoses:  Active Problems:   Abdominal abscess (HCC)   Intra-abdominal infection   Right upper quadrant pain   Procedures:none  Hospital Course: This a patient with a intra-abdominal abscess or phlegmon following a laparoscopic cholecystectomy performed at [facility] back in March or April of this year subsequent to that he has had abdominal pain and a workup showing this phlegmon with no drainable abscess it has persisted and he has had continued pain but no fevers. He had failed outpatient oral antibiotics was admitted to the hospital with a sign of abdominal tenderness. There was no drainage able fluid collection on CT scan. He was treated with IV antibiotics and seemed to improve therefore a PICC line was placed and he was continued on IV Zosyn with continued improvement. He is currently on Levaquin every 24 and will be discharged on IV Levaquin every 24 for 2 weeks with home health assisting with via the PICC line. He will follow-up in my office midweek resuming normal activities regular diet and may shower.  Consults: none  Disposition: 01-Home or Self Care  Discharge Instructions    Ambulatory referral to Home Health    Complete by:  As directed   Please evaluate LYRICK LAGRAND for admission to [facility].  Disciplines requested:  nursing  Services to provide: Home IV antibiotics  Physician to follow patient's care (the person listed here will be responsible for signing ongoing orders): R [employer] of Care Date: 02/15/2016  I certify that this patient is under my care and that I, or a Nurse Practitioner or Physician's Assistant working with me, had a face-to-face encounter that meets the physician face-to-face requirements with patient on 02/14/2016. The encounter with the patient was in  whole, or in part for the following medical condition(s) which is the primary reason for home health care (List medical condition). Intra-abdominal infection  Special Instructions:  Home health for IV Levaquin every 24   Does the patient have Medicare or Medicaid?:  No   The encounter with the patient was in whole, or in part, for the following medical condition, which is the primary reason for home health care:  Flank abscess   Reason for Medically Necessary Home Health Services:  Skilled Nursing- Administration and Training of Injectable Medication   My clinical findings support the need for the above services:  OTHER SEE COMMENTS   I certify that, based on my findings, the following services are medically necessary home health services:  Nursing   Further, I certify that my clinical findings support that this patient is homebound due to:  Pain interferes with ambulation/mobility       Medication List    STOP taking these medications   amoxicillin-clavulanate 875-125 MG tablet Commonly known as:  AUGMENTIN     TAKE these medications   amLODipine 10 MG tablet Commonly known as:  NORVASC Take 10 mg by mouth daily.   levofloxacin 750 MG/150ML Soln Commonly known as:  LEVAQUIN Inject 150 mLs (750 mg total) into the vein daily. Levaquin 750 mg IV every 24 hours via PICC line for 14 days   lisinopril-hydrochlorothiazide 20-25 MG tablet Commonly known as:  PRINZIDE,ZESTORETIC Take 1 tablet by mouth daily.   oxyCODONE 5 MG immediate release tablet Commonly known as:  Oxy IR/ROXICODONE Take 1-2 tablets (5-10 mg total) by mouth every  4 (four) hours as needed for moderate pain.   pantoprazole 40 MG tablet Commonly known as:  PROTONIX Take 1 tablet (40 mg total) by mouth daily.   PRILOSEC OTC 20 MG tablet Generic drug:  omeprazole Take 20 mg by mouth.   RA VITAMIN B-12 TR 1000 MCG Tbcr Generic drug:  Cyanocobalamin Take 1 tablet by mouth daily.   rosuvastatin 20 MG  tablet Commonly known as:  CRESTOR Take by mouth.      Follow-up Information    Dionne Miloichard Hamza Empson, MD Follow up in 6 day(s).   Specialty:  Surgery Contact information: [address] [place] [phone]           Lattie Hawichard E Chiann Goffredo, MD, FACS

## 2016-02-14 NOTE — Care Management Note (Signed)
Case Management Note  Patient Details  Name: Danny Klein MRN: [id number] Date of Birth: 11/02/1960  Subjective/Objective:     Copy of the Levaquin IV script and a referral was faxed to [facility] for IV Levaquin 750mg  IV q 24hours to begin 02/15/16 at 10am. A referral for home health RN to teach IV ABX administration and for PICC line monitoring was also faxed to [facility].                Action/Plan:   Expected Discharge Date:                  Expected Discharge Plan:     In-House Referral:     Discharge planning Services     Post Acute Care Choice:    Choice offered to:     DME Arranged:    DME Agency:     HH Arranged:    HH Agency:     Status of Service:     If discussed at [employer] Length of Stay Meetings, dates discussed:    Additional Comments:  Yarieliz Wasser A, RN 02/14/2016, 10:00 AM

## 2016-02-14 NOTE — Progress Notes (Signed)
MD ordered patient to be discharged home.  Discharge instructions were reviewed with the patient and wife and thye voiced understanding.  Patient instructed to call on Monday to make his follow-up appointment.    Prescriptions given to the patient.  Patient given some information about the PICC line and his antibiotic.  All patients questions were answered.  Patient leaving via wheelchair escorted by nursing.

## 2016-02-18 ENCOUNTER — Emergency Department: Payer: BC Managed Care – PPO

## 2016-02-18 ENCOUNTER — Encounter: Payer: BC Managed Care – PPO | Admitting: Surgery

## 2016-02-18 ENCOUNTER — Encounter: Payer: Self-pay | Admitting: Emergency Medicine

## 2016-02-18 ENCOUNTER — Inpatient Hospital Stay
Admission: EM | Admit: 2016-02-18 | Discharge: 2016-02-21 | DRG: 553 | Disposition: A | Discharge status: Home / Self Care | Payer: BC Managed Care – PPO | Attending: Internal Medicine | Admitting: Internal Medicine

## 2016-02-18 ENCOUNTER — Inpatient Hospital Stay: Payer: BC Managed Care – PPO

## 2016-02-18 DIAGNOSIS — M25511 Pain in right shoulder: Secondary | ICD-10-CM

## 2016-02-18 DIAGNOSIS — E43 Unspecified severe protein-calorie malnutrition: Secondary | ICD-10-CM | POA: Diagnosis present

## 2016-02-18 DIAGNOSIS — Z6839 Body mass index (BMI) 39.0-39.9, adult: Secondary | ICD-10-CM

## 2016-02-18 DIAGNOSIS — Z79899 Other long term (current) drug therapy: Secondary | ICD-10-CM

## 2016-02-18 DIAGNOSIS — M2550 Pain in unspecified joint: Secondary | ICD-10-CM | POA: Diagnosis not present

## 2016-02-18 DIAGNOSIS — N200 Calculus of kidney: Secondary | ICD-10-CM | POA: Diagnosis present

## 2016-02-18 DIAGNOSIS — Z981 Arthrodesis status: Secondary | ICD-10-CM | POA: Diagnosis not present

## 2016-02-18 DIAGNOSIS — Z9889 Other specified postprocedural states: Secondary | ICD-10-CM | POA: Diagnosis not present

## 2016-02-18 DIAGNOSIS — Z809 Family history of malignant neoplasm, unspecified: Secondary | ICD-10-CM | POA: Diagnosis not present

## 2016-02-18 DIAGNOSIS — M7551 Bursitis of right shoulder: Secondary | ICD-10-CM | POA: Diagnosis present

## 2016-02-18 DIAGNOSIS — M13811 Other specified arthritis, right shoulder: Secondary | ICD-10-CM | POA: Diagnosis present

## 2016-02-18 DIAGNOSIS — E669 Obesity, unspecified: Secondary | ICD-10-CM | POA: Diagnosis present

## 2016-02-18 DIAGNOSIS — Y828 Other medical devices associated with adverse incidents: Secondary | ICD-10-CM | POA: Diagnosis not present

## 2016-02-18 DIAGNOSIS — Z888 Allergy status to other drugs, medicaments and biological substances status: Secondary | ICD-10-CM | POA: Diagnosis not present

## 2016-02-18 DIAGNOSIS — Z8249 Family history of ischemic heart disease and other diseases of the circulatory system: Secondary | ICD-10-CM | POA: Diagnosis not present

## 2016-02-18 DIAGNOSIS — Z9049 Acquired absence of other specified parts of digestive tract: Secondary | ICD-10-CM

## 2016-02-18 DIAGNOSIS — I10 Essential (primary) hypertension: Secondary | ICD-10-CM | POA: Diagnosis present

## 2016-02-18 DIAGNOSIS — M009 Pyogenic arthritis, unspecified: Secondary | ICD-10-CM

## 2016-02-18 DIAGNOSIS — Z7951 Long term (current) use of inhaled steroids: Secondary | ICD-10-CM

## 2016-02-18 DIAGNOSIS — E785 Hyperlipidemia, unspecified: Secondary | ICD-10-CM | POA: Diagnosis present

## 2016-02-18 DIAGNOSIS — Z885 Allergy status to narcotic agent status: Secondary | ICD-10-CM | POA: Diagnosis not present

## 2016-02-18 DIAGNOSIS — K219 Gastro-esophageal reflux disease without esophagitis: Secondary | ICD-10-CM | POA: Diagnosis present

## 2016-02-18 DIAGNOSIS — R52 Pain, unspecified: Secondary | ICD-10-CM

## 2016-02-18 DIAGNOSIS — L02211 Cutaneous abscess of abdominal wall: Secondary | ICD-10-CM | POA: Diagnosis present

## 2016-02-18 DIAGNOSIS — T82868A Thrombosis of vascular prosthetic devices, implants and grafts, initial encounter: Secondary | ICD-10-CM | POA: Diagnosis present

## 2016-02-18 LAB — COMPREHENSIVE METABOLIC PANEL
ALBUMIN: 3.2 g/dL — AB (ref 3.5–5.0)
ALK PHOS: 78 U/L (ref 38–126)
ALT: 39 U/L (ref 17–63)
ANION GAP: 9 (ref 5–15)
AST: 34 U/L (ref 15–41)
BILIRUBIN TOTAL: 0.6 mg/dL (ref 0.3–1.2)
BUN: 19 mg/dL (ref 6–20)
CALCIUM: 9.1 mg/dL (ref 8.9–10.3)
CO2: 23 mmol/L (ref 22–32)
CREATININE: 0.72 mg/dL (ref 0.61–1.24)
Chloride: 107 mmol/L (ref 101–111)
GFR calc Af Amer: >60 mL/min (ref >60)
GFR calc non Af Amer: >60 mL/min (ref >60)
GLUCOSE: 111 mg/dL — AB (ref 65–99)
Potassium: 4 mmol/L (ref 3.5–5.1)
Sodium: 139 mmol/L (ref 135–145)
TOTAL PROTEIN: 7.7 g/dL (ref 6.5–8.1)

## 2016-02-18 LAB — SYNOVIAL CELL COUNT + DIFF, W/ CRYSTALS
Crystals, Fluid: NONE SEEN
Eosinophils-Synovial: 0 %
LYMPHOCYTES-SYNOVIAL FLD: 80 %
MONOCYTE-MACROPHAGE-SYNOVIAL FLUID: 11 %
NEUTROPHIL, SYNOVIAL: 9 %
OTHER CELLS-SYN: 0
WBC, Synovial: 346 /mm3 — ABNORMAL HIGH (ref 0–200)

## 2016-02-18 LAB — CBC WITH DIFFERENTIAL/PLATELET
BASOS PCT: 1 %
Basophils Absolute: 0.1 10*3/uL (ref 0–0.1)
Eosinophils Absolute: 0 10*3/uL (ref 0–0.7)
Eosinophils Relative: 0 %
HEMATOCRIT: 33.2 % — AB (ref 40.0–52.0)
HEMOGLOBIN: 11.3 g/dL — AB (ref 13.0–18.0)
LYMPHS ABS: 0.5 10*3/uL — AB (ref 1.0–3.6)
Lymphocytes Relative: 4 %
MCH: 28.3 pg (ref 26.0–34.0)
MCHC: 33.9 g/dL (ref 32.0–36.0)
MCV: 83.6 fL (ref 80.0–100.0)
MONOS PCT: 8 %
Monocytes Absolute: 1 10*3/uL (ref 0.2–1.0)
NEUTROS ABS: 10 10*3/uL — AB (ref 1.4–6.5)
Neutrophils Relative %: 87 %
Platelets: 376 10*3/uL (ref 150–440)
RBC: 3.97 MIL/uL — AB (ref 4.40–5.90)
RDW: 14.3 % (ref 11.5–14.5)
WBC: 11.6 10*3/uL — ABNORMAL HIGH (ref 3.8–10.6)

## 2016-02-18 LAB — SEDIMENTATION RATE: Sed Rate: 107 mm/hr — ABNORMAL HIGH (ref 0–20)

## 2016-02-18 MED ORDER — SENNOSIDES-DOCUSATE SODIUM 8.6-50 MG PO TABS
1.0000 | ORAL_TABLET | Freq: Every evening | ORAL | Status: DC | PRN
  Administered 2016-02-18: 1 via ORAL
  Filled 2016-02-18: qty 1

## 2016-02-18 MED ORDER — LIDOCAINE HCL (PF) 1 % IJ SOLN: 10.0000 mL | Freq: Once | INTRAMUSCULAR | Status: DC

## 2016-02-18 MED ORDER — ACETAMINOPHEN 325 MG PO TABS: 650.0000 mg | ORAL_TABLET | Freq: Four times a day (QID) | ORAL | Status: DC | PRN

## 2016-02-18 MED ORDER — LEVOFLOXACIN IN D5W 750 MG/150ML IV SOLN
750.0000 mg | INTRAVENOUS | Status: DC
  Administered 2016-02-18: 750 mg via INTRAVENOUS
  Filled 2016-02-18: qty 150

## 2016-02-18 MED ORDER — FLUTICASONE PROPIONATE 50 MCG/ACT NA SUSP
1.0000 | Freq: Every day | NASAL | Status: DC
  Administered 2016-02-19 – 2016-02-20 (×2): 1 via NASAL
  Filled 2016-02-18: qty 16

## 2016-02-18 MED ORDER — LIDOCAINE-EPINEPHRINE (PF) 1 %-1:200000 IJ SOLN
INTRAMUSCULAR | Status: AC
  Administered 2016-02-18: 13:00:00
  Filled 2016-02-18: qty 30

## 2016-02-18 MED ORDER — IOPAMIDOL (ISOVUE-300) INJECTION 61%
10.0000 mL | Freq: Once | INTRAVENOUS | Status: DC | PRN
  Filled 2016-02-18: qty 30

## 2016-02-18 MED ORDER — HYDROMORPHONE HCL 1 MG/ML IJ SOLN
1.0000 mg | Freq: Once | INTRAMUSCULAR | Status: AC
  Administered 2016-02-18: 1 mg via INTRAMUSCULAR
  Filled 2016-02-18: qty 1

## 2016-02-18 MED ORDER — LEVOFLOXACIN IN D5W 750 MG/150ML IV SOLN
750.0000 mg | INTRAVENOUS | Status: DC
  Filled 2016-02-18 (×2): qty 150

## 2016-02-18 MED ORDER — PIPERACILLIN-TAZOBACTAM 3.375 G IVPB 30 MIN: 3.3750 g | Freq: Three times a day (TID) | INTRAVENOUS | Status: DC

## 2016-02-18 MED ORDER — ROSUVASTATIN CALCIUM 20 MG PO TABS
20.0000 mg | ORAL_TABLET | Freq: Every day | ORAL | Status: DC
  Administered 2016-02-19 – 2016-02-21 (×3): 20 mg via ORAL
  Filled 2016-02-18 (×3): qty 1

## 2016-02-18 MED ORDER — LIDOCAINE-EPINEPHRINE (PF) 2 %-1:200000 IJ SOLN
10.0000 mL | Freq: Once | INTRAMUSCULAR | Status: DC
  Filled 2016-02-18: qty 10

## 2016-02-18 MED ORDER — ONDANSETRON HCL 4 MG PO TABS: 4.0000 mg | ORAL_TABLET | Freq: Four times a day (QID) | ORAL | Status: DC | PRN

## 2016-02-18 MED ORDER — SODIUM CHLORIDE 0.9 % IJ SOLN: 10.0000 mL | INTRAMUSCULAR | Status: DC | PRN

## 2016-02-18 MED ORDER — KETOROLAC TROMETHAMINE 30 MG/ML IJ SOLN
30.0000 mg | Freq: Four times a day (QID) | INTRAMUSCULAR | Status: DC | PRN
  Administered 2016-02-18 – 2016-02-21 (×8): 30 mg via INTRAVENOUS
  Filled 2016-02-18 (×8): qty 1

## 2016-02-18 MED ORDER — DOCUSATE SODIUM 100 MG PO CAPS
100.0000 mg | ORAL_CAPSULE | Freq: Two times a day (BID) | ORAL | Status: DC
  Administered 2016-02-19 – 2016-02-21 (×5): 100 mg via ORAL
  Filled 2016-02-18 (×5): qty 1

## 2016-02-18 MED ORDER — ONDANSETRON HCL 4 MG/2ML IJ SOLN: 4.0000 mg | Freq: Four times a day (QID) | INTRAMUSCULAR | Status: DC | PRN

## 2016-02-18 MED ORDER — PANTOPRAZOLE SODIUM 40 MG PO TBEC
40.0000 mg | DELAYED_RELEASE_TABLET | Freq: Every day | ORAL | Status: DC
  Administered 2016-02-19 – 2016-02-21 (×3): 40 mg via ORAL
  Filled 2016-02-18 (×3): qty 1

## 2016-02-18 MED ORDER — PIPERACILLIN-TAZOBACTAM 3.375 G IVPB
3.3750 g | Freq: Three times a day (TID) | INTRAVENOUS | Status: DC
  Administered 2016-02-18 – 2016-02-19 (×2): 3.375 g via INTRAVENOUS
  Filled 2016-02-18 (×5): qty 50

## 2016-02-18 MED ORDER — ENOXAPARIN SODIUM 40 MG/0.4ML ~~LOC~~ SOLN
40.0000 mg | Freq: Every day | SUBCUTANEOUS | Status: DC
  Administered 2016-02-18 – 2016-02-20 (×3): 40 mg via SUBCUTANEOUS
  Filled 2016-02-18 (×3): qty 0.4

## 2016-02-18 MED ORDER — VITAMIN B-12 1000 MCG PO TABS
1000.0000 ug | ORAL_TABLET | Freq: Every day | ORAL | Status: DC
  Administered 2016-02-19 – 2016-02-21 (×3): 1000 ug via ORAL
  Filled 2016-02-18 (×3): qty 1

## 2016-02-18 MED ORDER — OXYCODONE HCL 5 MG PO TABS
5.0000 mg | ORAL_TABLET | ORAL | Status: DC | PRN
  Administered 2016-02-18 – 2016-02-21 (×7): 10 mg via ORAL
  Filled 2016-02-18 (×7): qty 2

## 2016-02-18 MED ORDER — ACETAMINOPHEN 650 MG RE SUPP: 650.0000 mg | Freq: Four times a day (QID) | RECTAL | Status: DC | PRN

## 2016-02-18 NOTE — ED Notes (Signed)
Attempted to call report unsuccessful having to change bed assignment

## 2016-02-18 NOTE — H&P (Signed)
[facility] - [place] at [facility]   PATIENT NAME: Danny Klein    MR#:  [id number]  DATE OF BIRTH:  11/12/1960  DATE OF ADMISSION:  02/18/2016  PRIMARY CARE PHYSICIAN: Klein, Danny Guthrie, MD   REQUESTING/REFERRING PHYSICIAN: Cyril Loosen, MD   CHIEF COMPLAINT:  Rt shoulder pain  HISTORY OF PRESENT ILLNESS:  Danny Klein  is a 55 y.o. male with a known history of Essential hypertension, hyperlipidemia, obesity and recent history of intra-abdominal abscess, currently on IV antibiotics levofloxacin for a total of 14 days just started 4 days ago is presenting to the ED with a chief complaint of severe right shoulder pain. Patient is reporting on and off fevers and the past 2 to 3 days. His pain is 10 out of 10 and worse today and kidney stone pain. Wife at bedside. Joint aspiration was done in the ED that has revealed no crystals. Cultures were pending. Patient is started on IV Zosyn. Orthopedics Dr. Martha Klein has recommended to get MRI of the shoulder  PAST MEDICAL HISTORY:   Past Medical History:  Diagnosis Date  . Hypertension    Hyperlipidemia, obesity, GERD, intra-abdominal abscess PAST SURGICAL HISTOIRY:   Past Surgical History:  Procedure Laterality Date  . CARDIAC CATHETERIZATION  2009  . CHOLECYSTECTOMY    . SPINAL FUSION W/ LUQUE UNIT ROD  2013    SOCIAL HISTORY:   Social History  Substance Use Topics  . Smoking status: Never Smoker  . Smokeless tobacco: Never Used  . Alcohol use 0.6 oz/week    1 Cans of beer per week     Comment: drinks occassionally; 1 can beer per week    FAMILY HISTORY:   Family History  Problem Relation Age of Onset  . Cancer Mother   . Heart disease Mother   . Heart disease Father     DRUG ALLERGIES:   Allergies  Allergen Reactions  . Hydrocodone Itching and Other (See Comments)    Made him feel violent  . Meperidine Nausea And Vomiting and Other (See Comments)    Nausea and severe vomiting , talking  out of head     REVIEW OF SYSTEMS:  CONSTITUTIONAL: Intermittent episodes of fever for the past 3-4 days, denies weakness.  EYES: No blurred or double vision.  EARS, NOSE, AND THROAT: No tinnitus or ear pain.  RESPIRATORY: No cough, shortness of breath, wheezing or hemoptysis.  CARDIOVASCULAR: No chest pain, orthopnea, edema.  GASTROINTESTINAL: Reporting recent history of intra-abdominal abscesses on IV levofloxacin for a total of 14 days currently on day 4, denies nausea, vomiting, diarrhea or abdominal pain.  GENITOURINARY: No dysuria, hematuria.  ENDOCRINE: No polyuria, nocturia,  HEMATOLOGY: No anemia, easy bruising or bleeding SKIN: No rash or lesion. MUSCULOSKELETAL: Has severe right shoulder pain No history of arthritis.   NEUROLOGIC: No tingling, numbness, weakness.  PSYCHIATRY: No anxiety or depression.   MEDICATIONS AT HOME:   Prior to Admission medications   Medication Sig Start Date End Date Taking? Authorizing Provider  Cyanocobalamin (RA VITAMIN B-12 TR) 1000 MCG TBCR Take 1 tablet by mouth daily.    Yes Historical Provider, MD  fluticasone (FLONASE) 50 MCG/ACT nasal spray Place 1 spray into both nostrils daily.   Yes Historical Provider, MD  levofloxacin (LEVAQUIN) 750 MG/150ML SOLN Inject 150 mLs (750 mg total) into the vein daily. Levaquin 750 mg IV every 24 hours via PICC line for 14 days 02/14/16  Yes Danny Haw, MD  omeprazole (PRILOSEC OTC) 20 MG  tablet Take 20 mg by mouth. 09/07/06  Yes Historical Provider, MD  oxyCODONE (OXY IR/ROXICODONE) 5 MG immediate release tablet Take 1-2 tablets (5-10 mg total) by mouth every 4 (four) hours as needed for moderate pain. 02/14/16  Yes Danny Hawichard E Cooper, MD  rosuvastatin (CRESTOR) 20 MG tablet Take 20 mg by mouth daily.  12/10/15  Yes Historical Provider, MD      VITAL SIGNS:  Blood pressure 130/87, pulse 80, temperature 97.7 F (36.5 C), temperature source Oral, resp. rate 18, height 5\' 6"  (1.676 m), weight 113.4 kg (250  lb), SpO2 97 %.  PHYSICAL EXAMINATION:  GENERAL:  55 y.o.-year-old patient lying in the bed with no acute distress.  EYES: Pupils equal, round, reactive to light and accommodation. No scleral icterus. Extraocular muscles intact.  HEENT: Head atraumatic, normocephalic. Oropharynx and nasopharynx clear.  NECK:  Supple, no jugular venous distention. No thyroid enlargement, no tenderness.  LUNGS: Normal breath sounds bilaterally, no wheezing, rales,rhonchi or crepitation. No use of accessory muscles of respiration.  CARDIOVASCULAR: S1, S2 normal. No murmurs, rubs, or gallops.  ABDOMEN: Soft, nontender, nondistended. Bowel sounds present. No organomegaly or mass.  EXTREMITIES: Right shoulder is severely tender, range of motion is limited from pain No pedal edema, cyanosis, or clubbing.  NEUROLOGIC: Cranial nerves II through XII are intact. Muscle strength 5/5 in all extremities. Sensation intact. Gait not checked.  PSYCHIATRIC: The patient is alert and oriented x 3.  SKIN: No obvious rash, lesion, or ulcer.   LABORATORY PANEL:   CBC  Recent Labs Lab 02/18/16 1141  WBC 11.6*  HGB 11.3*  HCT 33.2*  PLT 376   ------------------------------------------------------------------------------------------------------------------  Chemistries   Recent Labs Lab 02/18/16 1141  NA 139  K 4.0  CL 107  CO2 23  GLUCOSE 111*  BUN 19  CREATININE 0.72  CALCIUM 9.1  AST 34  ALT 39  ALKPHOS 78  BILITOT 0.6   ------------------------------------------------------------------------------------------------------------------  Cardiac Enzymes No results for input(s): TROPONINI in the last 168 hours. ------------------------------------------------------------------------------------------------------------------  RADIOLOGY:  Dg Shoulder Right  Result Date: 02/18/2016 CLINICAL DATA:  Pain EXAM: RIGHT SHOULDER - 2+ VIEW COMPARISON:  None. FINDINGS: Frontal, Y scapular, and axillary images were  obtained. There is no acute fracture or dislocation. There is bony overgrowth in the acromioclavicular joint with intra-articular calcification in this area. The glenohumeral joint appears unremarkable. No erosive change. Visualized right lung is clear. 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 Question residua of old trauma in this area. No acute fracture or dislocation. Glenohumeral joint appears unremarkable. Electronically Signed   By: Bretta BangWilliam  Woodruff III M.D.   On: 02/18/2016 09:07   [place] Venous Img Upper Uni Right  Result Date: 02/18/2016 CLINICAL DATA:  Right shoulder pain for several hours EXAM: Right UPPER EXTREMITY VENOUS DOPPLER ULTRASOUND TECHNIQUE: Gray-scale sonography with graded compression, as well as color Doppler and duplex ultrasound were performed to evaluate the upper extremity deep venous system from the level of the subclavian vein and including the jugular, axillary, basilic, radial, ulnar and upper cephalic vein. Spectral Doppler was utilized to evaluate flow at rest and with distal augmentation maneuvers. COMPARISON:  None. FINDINGS: Contralateral Subclavian Vein: Respiratory phasicity is normal and symmetric with the symptomatic side. No evidence of thrombus. Normal compressibility. Internal Jugular Vein: No evidence of thrombus. Normal compressibility, respiratory phasicity and response to augmentation. Subclavian Vein: No evidence of thrombus. Normal compressibility, respiratory phasicity and response to augmentation. Axillary Vein: No evidence of thrombus. Normal compressibility,  respiratory phasicity and response to augmentation. Cephalic Vein: No evidence of thrombus. Normal compressibility, respiratory phasicity and response to augmentation. Basilic Vein: No evidence of thrombus. Normal compressibility, respiratory phasicity and response to augmentation. Brachial Veins: No evidence of thrombus. Normal compressibility,  respiratory phasicity and response to augmentation. Radial Veins: No evidence of thrombus. Normal compressibility, respiratory phasicity and response to augmentation. Ulnar Veins: No evidence of thrombus. Normal compressibility, respiratory phasicity and response to augmentation. Venous Reflux:  None visualized. Other Findings:  None visualized. IMPRESSION: No definitive deep venous thrombosis is noted. Electronically Signed   By: Alcide CleverMark  Lukens M.D.   On: 02/18/2016 11:10   Dg Fluoro Guided Needle Plc Aspiration/injection Loc  Result Date: 02/18/2016 CLINICAL DATA:  Right shoulder pain. EXAM: RIGHT SHOULDER ASPIRATION UNDER FLUOROSCOPY FLUOROSCOPY TIME:  Radiation Exposure Index (as provided by the fluoroscopic device): 3.1 mGy PROCEDURE: Overlying skin prepped with ChloraPrep, draped in the usual sterile fashion, and infiltrated locally with buffered Lidocaine. Curved 20 gauge spinal needle advanced to the superolateral margin of the right humeral head. 1 ml of Isovue-300 injected easily. Diagnostic injection of iodinated contrast demonstrates intra-articular spread without intravascular component. Aspiration was attempted with no return of fluid. At this time 8 mL of sterile saline was injected into the right glenohumeral joint and subsequently aspirated. A total volume of 6 mL of the aspirated fluid was submitted for laboratory analysis. IMPRESSION: Technically successful right shoulder aspiration under fluoroscopy. Electronically Signed   By: Elige KoHetal  Patel   On: 02/18/2016 14:44    EKG:   Orders placed or performed in visit on 04/08/04  . EKG 12-Lead    IMPRESSION AND PLAN:   55 year old male with severe shoulder pain and intermittent episodes of fevers for the past 3 days. Patient is on day 4/14 his IV levofloxacin via PICC line for recent history of intra-abdominal abscess  #Acute right shoulder pain possible septic joint Meets septic criteria given the leukocytosis and intermittent episodes of  fever while he is on IV levofloxacin Status post fluoroscopy-guided needle aspiration of the right shoulder Follow-up on the joint fluid cultures MRI of the shoulder is ordered which is pending Consult orthopedics Dr. Martha ClanKrasinski Consult infectious disease Dr. Sampson GoonFitzgerald  #Recent history of intra-abdominal abscess Continue IV levofloxacin 750 mg for total of 14 days. Patient has reported that he has received 4 days of IV levofloxacin so far If necessary will consult Dr. Excell Seltzerooper surgery  #Essential hypertension Currently patient's blood pressure is soft, not considering any antihypertensives at this time  #Hyperlipidemia Continue home medication Crestor  #GERD-continue home medication   All the records are reviewed and case discussed with ED provider. Management plans discussed with the patient, family and they are in agreement.  CODE STATUS: fc/wife is the HCPOA  TOTAL TIME TAKING CARE OF THIS PATIENT: 45  minutes.   Note: This dictation was prepared with Dragon dictation along with smaller phrase technology. Any transcriptional errors that result from this process are unintentional.  Ramonita LabGouru, Jaydah Stahle M.D on 02/18/2016 at 6:27 PM  Between 7am to 6pm - Pager - [phone]  After 6pm go to [email or web address] - password [email or web address] [email or web address]  [place] [facility] [facility]  Office  [phone]  CC: Primary care physician; [facility] Hospital[facility]ELD[facility]CH, Danny GuthrieALE E, MD

## 2016-02-18 NOTE — ED Notes (Signed)
Pt and wife given cola and sandwich tray to eat at this time.

## 2016-02-18 NOTE — ED Triage Notes (Signed)
Reports waking up with pain in right side of neck radiating down arm.  +PMS to arm and hand. No swelling noted.  Pain increases with movement. Skin w/d with good color.

## 2016-02-18 NOTE — ED Notes (Signed)
See triage note. States he developed a cramp-like pain to right shoulder about 2 am  States pain has become worse  Pain starts from mid clavicle area and radiates into upper arm to the elbow  No deformity  Positive pulses noted decreased movement d/t pain

## 2016-02-18 NOTE — ED Provider Notes (Signed)
[facility] ____________________________________________  Time seen: Approximately 8:14 AM  I have reviewed the triage vital signs and the nursing notes.   HISTORY  Chief Complaint Neck Pain  HPI Danny Klein is a 55 y.o. male presenting with wife at bedside for the complaints of right shoulder pain. Patient reports right shoulder pain since this morning. Patient reports he woke up at 2 AM to go to the restroom. Patient states at that time he noticed some right shoulder pain that has since worsened. Patient reports at this time he has a right shoulder pain with some right neck and right upper arm pain. Patient reports pain is severe and limiting his range of motion because of the pain. Patient reports he is able to move the arm and shoulder but with pain. Denies pain radiation. Denies numbness or loss of sensation. Denies fall, direct injury or trauma. Patient reports he does sleep on his shoulder with his right arm up.  Patient denies any pain with elbow movement, hand or wrist movement. Reports sensation is a normal and intact to both arms. Reports right upper arm and shoulder hurts with any slight movement. Patient reports sitting completely still, helps with pain. Denies any chest pain, shortness of breath, chest pain with deep breath, neck pain, back pain, fevers, headache, dizziness, decreased sensation, numbness, abdominal pain. Reports right hand dominant.  Patient and wife reports that he has recently been in the hospital, was discharged this past Sunday after having a reinfection of an abdominal abscess from gallbladder removal. Reporting abdomen is no longer painful. Patient reports he has been feeling much better since recent hospitalization. Reports he and his wife do still administer antibiotics via a left arm PICC line. Patient denies any recent sticks, blood draw to right arm. Reports right arm and shoulder felt fine last night.  PCP: Marina Goodell,  MD   Past Medical History:  Diagnosis Date  . Hypertension     Patient Active Problem List   Diagnosis Date Noted  . Intra-abdominal infection   . Right upper quadrant pain   . Abdominal abscess (HCC) 02/03/2016  . Essential hypertension 01/26/2016  . Hyperlipidemia 01/26/2016  . GERD (gastroesophageal reflux disease) 01/26/2016  . Anxiety 01/26/2016  . Obstructive sleep apnea syndrome 07/31/2014  . Hypogonadism in male 12/17/2013  . Shoulder bursitis 11/04/2013  . Neck pain 11/04/2013  . Disorder of bursae and tendons in shoulder region 11/04/2013  . Pain in joint involving lower leg 03/07/2012  . Thoracic or lumbosacral neuritis or radiculitis 11/04/2011  . 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 Acquired spondylolisthesis 11/04/2011    Past Surgical History:  Procedure Laterality Date  . CARDIAC CATHETERIZATION  2009  . CHOLECYSTECTOMY    . SPINAL FUSION W/ LUQUE UNIT ROD  2013     Current Facility-Administered Medications:  .  lidocaine-EPINEPHrine (XYLOCAINE W/EPI) 2 %-1:200000 (PF) injection 10 mL, 10 mL, Other, Once, Renford Dills, NP  Current Outpatient Prescriptions:  .  amLODipine (NORVASC) 10 MG tablet, Take 10 mg by mouth daily. , Disp: , Rfl:  .  Cyanocobalamin (RA VITAMIN B-12 TR) 1000 MCG TBCR, Take 1 tablet by mouth daily. , Disp: , Rfl:  .  levofloxacin (LEVAQUIN) 750 MG/150ML SOLN, Inject 150 mLs (750 mg total) into the vein daily. Levaquin 750 mg IV every 24 hours via PICC line for 14 days, Disp: 1000 mL, Rfl: 1 .  lisinopril-hydrochlorothiazide (PRINZIDE,ZESTORETIC) 20-25 MG tablet, Take 1 tablet by mouth daily. ,  Disp: , Rfl:  .  omeprazole (PRILOSEC OTC) 20 MG tablet, Take 20 mg by mouth., Disp: , Rfl:  .  oxyCODONE (OXY IR/ROXICODONE) 5 MG immediate release tablet, Take 1-2 tablets (5-10 mg total) by mouth every 4 (four) hours as needed for moderate pain., Disp: 30 tablet, Rfl: 0 .  pantoprazole (PROTONIX) 40 MG  tablet, Take 1 tablet (40 mg total) by mouth daily., Disp: 30 tablet, Rfl: 1 .  rosuvastatin (CRESTOR) 20 MG tablet, Take 20 mg by mouth daily. , Disp: , Rfl:   Allergies Hydrocodone and Meperidine  Family History  Problem Relation Age of Onset  . Cancer Mother   . Heart disease Mother   . Heart disease Father     Social History Social History  Substance Use Topics  . Smoking status: Never Smoker  . Smokeless tobacco: Never Used  . Alcohol use 0.6 oz/week    1 Cans of beer per week     Comment: drinks occassionally; 1 can beer per week    Review of Systems Constitutional: No fever/chills Eyes: No visual changes. ENT: No sore throat. Cardiovascular: Denies chest pain. Respiratory: Denies shortness of breath. Gastrointestinal: No abdominal pain.  No nausea, no vomiting.  No diarrhea.  No constipation. Genitourinary: Negative for dysuria. Musculoskeletal: Negative for back pain.As above. Skin: Negative for rash. Neurological: Negative for headaches, focal weakness or numbness.  10-point ROS otherwise negative.  ____________________________________________   PHYSICAL EXAM:  VITAL SIGNS: ED Triage Vitals  Enc Vitals Group     BP 02/18/16 0752 117/74     Pulse Rate 02/18/16 0752 89     Resp 02/18/16 0752 20     Temp 02/18/16 0752 97.7 F (36.5 C)     Temp Source 02/18/16 0752 Oral     SpO2 02/18/16 0752 96 %     Weight 02/18/16 0754 239 lb (108.4 kg)     Height 02/18/16 0754 5\' 10"  (1.778 m)     Head Circumference --      Peak Flow --      Pain Score --      Pain Loc --      Pain Edu? --      Excl. in GC? --     Constitutional: Alert and oriented. Well appearing and in no acute distress. Eyes: Conjunctivae are normal. PERRL. EOMI. ENT      Head: Normocephalic and atraumatic.      Nose: No congestion/rhinnorhea.      Mouth/Throat: Mucous membranes are moist.Oropharynx non-erythematous. Neck: No stridor. Supple without meningismus.    Hematological/Lymphatic/Immunilogical: No cervical lymphadenopathy. Cardiovascular: Normal rate, regular rhythm. Grossly normal heart sounds.  Good peripheral circulation. Respiratory: Normal respiratory effort without tachypnea nor retractions. Breath sounds are clear and equal bilaterally. No wheezes/rales/rhonchi.. Gastrointestinal: Soft and nontender. No distention. Musculoskeletal:  Nontender with normal range of motion in all extremities. No midline cervical, thoracic or lumbar tenderness to palpation. Bilateral pedal pulses equal and easily palpated.  except: Right proximal humerus and right shoulder moderate tenderness to direct palpation, proximal bicep moderate tenderness to palpation, mild tenderness to posterior trapezius, diffuse right shoulder tenderness, no tenderness midhumerus and below. Full range of motion present to right elbow, right wrist and right hand without pain. Patient guarding right shoulder and unable to laterally abduct right arm from shoulder. Right hand normal distal capillary refill, normal sensation, able to touch thumb to each finger tip of right hand without pain, right hand no motor or tendon deficits. No right upper  extremity erythema, ecchymosis or skin changes noted. Chest and torso nontender to palpation. Neurologic:  Normal speech and language. No gross focal neurologic deficits are appreciated. Speech is normal. No gait instability.  Skin:  Skin is warm, dry and intact. No rash noted. Psychiatric: Mood and affect are normal. Speech and behavior are normal. Patient exhibits appropriate insight and judgment   ___________________________________________   LABS (all labs ordered are listed, but only abnormal results are displayed)  Labs Reviewed  CBC WITH DIFFERENTIAL/PLATELET - Abnormal; Notable for the following:       Result Value   WBC 11.6 (*)    RBC 3.97 (*)    Hemoglobin 11.3 (*)    HCT 33.2 (*)    Neutro Abs 10.0 (*)    Lymphs Abs 0.5 (*)     All other components within normal limits  COMPREHENSIVE METABOLIC PANEL - Abnormal; Notable for the following:    Glucose, Bld 111 (*)    Albumin 3.2 (*)    All other components within normal limits  SEDIMENTATION RATE - Abnormal; Notable for the following:    Sed Rate 107 (*)    All other components within normal limits  BODY FLUID CULTURE  SYNOVIAL CELL COUNT + DIFF, W/ CRYSTALS    RADIOLOGY  Dg Shoulder Right  Result Date: 02/18/2016 CLINICAL DATA:  Pain EXAM: RIGHT SHOULDER - 2+ VIEW COMPARISON:  None. FINDINGS: Frontal, Y scapular, and axillary images were obtained. There is no acute fracture or dislocation. There is bony overgrowth in the acromioclavicular joint with intra-articular calcification in this area. The glenohumeral joint appears unremarkable. No erosive change. Visualized right lung is clear. 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 Question residua of old trauma in this area. No acute fracture or dislocation. Glenohumeral joint appears unremarkable. Electronically Signed   By: Bretta Bang III M.D.   On: 02/18/2016 09:07   US Venous Img Upper Uni Right  Result Date: 02/18/2016 CLINICAL DATA:  Right shoulder pain for several hours EXAM: Right UPPER EXTREMITY VENOUS DOPPLER ULTRASOUND TECHNIQUE: Gray-scale sonography with graded compression, as well as color Doppler and duplex ultrasound were performed to evaluate the upper extremity deep venous system from the level of the subclavian vein and including the jugular, axillary, basilic, radial, ulnar and upper cephalic vein. Spectral Doppler was utilized to evaluate flow at rest and with distal augmentation maneuvers. COMPARISON:  None. FINDINGS: Contralateral Subclavian Vein: Respiratory phasicity is normal and symmetric with the symptomatic side. No evidence of thrombus. Normal compressibility. Internal Jugular Vein: No evidence of thrombus. Normal compressibility,  respiratory phasicity and response to augmentation. Subclavian Vein: No evidence of thrombus. Normal compressibility, respiratory phasicity and response to augmentation. Axillary Vein: No evidence of thrombus. Normal compressibility, respiratory phasicity and response to augmentation. Cephalic Vein: No evidence of thrombus. Normal compressibility, respiratory phasicity and response to augmentation. Basilic Vein: No evidence of thrombus. Normal compressibility, respiratory phasicity and response to augmentation. Brachial Veins: No evidence of thrombus. Normal compressibility, respiratory phasicity and response to augmentation. Radial Veins: No evidence of thrombus. Normal compressibility, respiratory phasicity and response to augmentation. Ulnar Veins: No evidence of thrombus. Normal compressibility, respiratory phasicity and response to augmentation. Venous Reflux:  None visualized. Other Findings:  None visualized. IMPRESSION: No definitive deep venous thrombosis is noted. Electronically Signed   By: Alcide Clever M.D.   On: 02/18/2016 11:10   ____________________________________________   PROCEDURES .Joint Aspiration/Arthrocentesis Date/Time: 02/18/2016 1:17 PM Performed by: Jene Every Authorized by: Renford Dills  Consent:    Consent obtained:  Verbal and written   Consent given by:  Patient   Risks discussed:  Bleeding, infection, pain, incomplete drainage, nerve damage and poor cosmetic result   Alternatives discussed:  No treatment, delayed treatment, alternative treatment, observation and referral Location:    Location:  Shoulder Anesthesia (see MAR for exact dosages):    Anesthesia method:  Local infiltration   Local anesthetic:  Lidocaine 1% WITH epi Procedure details:    Preparation: Patient was prepped and draped in usual sterile fashion     Needle gauge:  18 G   Ultrasound guidance: no     Approach:  Anterior   Aspirate amount:  Unsuccessful fluid aspiration   Specimen  collected: no   Post-procedure details:    Dressing:  Adhesive bandage   Patient tolerance of procedure:  Tolerated well, no immediate complications Comments:     Also discussed risk of tendon rupture.   Aspiration unsuccessful; concern unsuccessful second to body habitus.    performed by Jamey Reas Kinner and Stevie KernL Cayle Cordoba     INITIAL IMPRESSION / ASSESSMENT AND PLAN / ED COURSE  Pertinent labs & imaging results that were available during my care of the patient were reviewed by me and considered in my medical decision making (see chart for details).  Well-appearing patient. No acute distress. Wife at bedside. Presenting for the complaints of right proximal humeral and right shoulder pain. Denies known injury or direct trauma. Reports does sleep on that shoulder and arm at night, and reports waking up to go to restroom at 2 AM this morning and noticed pain. Reports pain gradual increase. Patient reports pain is fully reproducible by direct palpation and with movement. Pain improves in sitting still no movement. 1 mg IM Dilaudid. Will evaluate right shoulder x-ray and right upper extremity ultrasound.  0845: Post IM  Dilaudid, patient reports pain has improved to 8 out of 10 but states still unable to move right shoulder. Denies any other changes or other pain.  1015: Patient in ultrasound.   1115: Return from ultrasound. Ultrasound result reviewed. Ultrasound negative for deep venous thrombosis per radiologist. Patient reports pain is slightly improved then initial arrival. Patient reports pain currently 6 out of 10. Discussed patient and planning care and further evaluation with Dr. Cyril LoosenKinner, who also saw patient. Patient pain now localized to right shoulder joint, as with recent hospitalizations and infections, concerned for septic joint. Also discussed inflammatory, gouty and muscular pains. Discussed joint aspiration with patient.   1300: Labs reviewed. See joint aspiration note. Consent obtained,  discussed risks with patient. Joint aspiration unsuccessful. Will discuss with radiology.   1400: Patient in radiology for right shoulder aspiration under fluoroscopy.   1600: Patient ambulatory in room. Patient states at this time is  Moderate. Discussed in detail with patient and spouse awaiting results. Dr. Cyril LoosenKinner to assume care and follow-up with patient.   Discussed follow up with Primary care physician this week. Discussed follow up and return parameters including no resolution or any worsening concerns. Patient verbalized understanding and agreed to plan.   ____________________________________________   FINAL CLINICAL IMPRESSION(S) / ED DIAGNOSES  Final diagnoses:  Pain  Joint pain     New Prescriptions   No medications on file    Note: This dictation was prepared with Dragon dictation along with smaller phrase technology. Any transcriptional errors that result from this process are unintentional.    Clinical Course      Renford DillsLindsey Rylen Hou, NP 02/18/16 1601  Jene Every, MD 02/18/16 [phone]

## 2016-02-18 NOTE — ED Notes (Signed)
Resting at present  States pain is 6/10 when lying still   Family remains at bedside

## 2016-02-18 NOTE — Consult Note (Signed)
ORTHOPAEDIC CONSULTATION  REQUESTING PHYSICIAN: Houston Siren, MD  Chief Complaint: Right shoulder pain  HPI: GILFORD LARDIZABAL is a 55 y.o. male who complains of acute onset of pain in the right shoulder overnight. He is seen in his hospital room with his wife at the bedside. Patient is being admitted for intractable right shoulder pain. He has a complicated history which includes a previous laparoscopic cholecystectomy at [facility] around April 2017. He is being treated currently for an abscess behind his liver with IV Levaquin via PICC line.  Patient states his pain woke him up overnight from sleep. He denies any recent right shoulder injury. 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   Patient had aspiration of his right glenohumeral joint in radiology today. Fluid was not obtained on initial aspirate, per Dr. Cyril Loosen in ER, but irrigation and aspirate was sent to lab.  No crystals were seen in the aspirated saline. Gram stain shows rare white blood cells but no organisms. Cultures are pending.  He denies any neck pain or radicular symptoms. Pain does radiate down the upper arm and earlier today extended below his elbow, but this has improved. His pain is mainly localized over the right shoulder. His pain is exacerbated with motion but his pain is relatively controlled at rest. He states he received Toradol this evening and his pain has improved.  Patient states he is not having any abdominal pain currently. He has a PICC line in his left antecubital fossa.  He denies any recent fevers but has had chills.  Past Medical History:  Diagnosis Date  . Hypertension    Past Surgical History:  Procedure Laterality Date  . CARDIAC CATHETERIZATION  2009  . CHOLECYSTECTOMY    . SPINAL FUSION W/ LUQUE UNIT ROD  2013   Social History   Social History  . Marital status: Married    Spouse name: N/A  . Number of children: N/A  . Years of education: N/A   Social History Main Topics  . Smoking  status: Never Smoker  . Smokeless tobacco: Never Used  . Alcohol use 0.6 oz/week    1 Cans of beer per week     Comment: drinks occassionally; 1 can beer per week  . Drug use: No  . Sexual activity: Yes   Other Topics Concern  . None   Social History Narrative  . None   Family History  Problem Relation Age of Onset  . Cancer Mother   . Heart disease Mother   . Heart disease Father    Allergies  Allergen Reactions  . Hydrocodone Itching and Other (See Comments)    Made him feel violent  . Meperidine Nausea And Vomiting and Other (See Comments)    Nausea and severe vomiting , talking out of head    Prior to Admission medications   Medication Sig Start Date End Date Taking? Authorizing Provider  Cyanocobalamin (RA VITAMIN B-12 TR) 1000 MCG TBCR Take 1 tablet by mouth daily.    Yes Historical Provider, MD  fluticasone (FLONASE) 50 MCG/ACT nasal spray Place 1 spray into both nostrils daily.   Yes Historical Provider, MD  levofloxacin (LEVAQUIN) 750 MG/150ML SOLN Inject 150 mLs (750 mg total) into the vein daily. Levaquin 750 mg IV every 24 hours via PICC line for 14 days 02/14/16  Yes Lattie Haw, MD  omeprazole (PRILOSEC OTC) 20 MG tablet Take 20 mg by mouth. 09/07/06  Yes Historical Provider, MD  oxyCODONE (OXY IR/ROXICODONE) 5  MG immediate release tablet Take 1-2 tablets (5-10 mg total) by mouth every 4 (four) hours as needed for moderate pain. 02/14/16  Yes Lattie Hawichard E Cooper, MD  rosuvastatin (CRESTOR) 20 MG tablet Take 20 mg by mouth daily.  12/10/15  Yes Historical Provider, MD   Dg Shoulder Right  Result Date: 02/18/2016 CLINICAL DATA:  Pain EXAM: RIGHT SHOULDER - 2+ VIEW COMPARISON:  None. FINDINGS: Frontal, Y scapular, and axillary images were obtained. There is no acute fracture or dislocation. There is bony overgrowth in the acromioclavicular joint with intra-articular calcification in this area. The glenohumeral joint appears unremarkable. No erosive change. Visualized  right lung is clear. 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 Question residua of old trauma in this area. No acute fracture or dislocation. Glenohumeral joint appears unremarkable. Electronically Signed   By: Bretta BangWilliam  Woodruff III M.D.   On: 02/18/2016 09:07   [place] Venous Img Upper Uni Right  Result Date: 02/18/2016 CLINICAL DATA:  Right shoulder pain for several hours EXAM: Right UPPER EXTREMITY VENOUS DOPPLER ULTRASOUND TECHNIQUE: Gray-scale sonography with graded compression, as well as color Doppler and duplex ultrasound were performed to evaluate the upper extremity deep venous system from the level of the subclavian vein and including the jugular, axillary, basilic, radial, ulnar and upper cephalic vein. Spectral Doppler was utilized to evaluate flow at rest and with distal augmentation maneuvers. COMPARISON:  None. FINDINGS: Contralateral Subclavian Vein: Respiratory phasicity is normal and symmetric with the symptomatic side. No evidence of thrombus. Normal compressibility. Internal Jugular Vein: No evidence of thrombus. Normal compressibility, respiratory phasicity and response to augmentation. Subclavian Vein: No evidence of thrombus. Normal compressibility, respiratory phasicity and response to augmentation. Axillary Vein: No evidence of thrombus. Normal compressibility, respiratory phasicity and response to augmentation. Cephalic Vein: No evidence of thrombus. Normal compressibility, respiratory phasicity and response to augmentation. Basilic Vein: No evidence of thrombus. Normal compressibility, respiratory phasicity and response to augmentation. Brachial Veins: No evidence of thrombus. Normal compressibility, respiratory phasicity and response to augmentation. Radial Veins: No evidence of thrombus. Normal compressibility, respiratory phasicity and response to augmentation. Ulnar Veins: No evidence of thrombus. Normal compressibility,  respiratory phasicity and response to augmentation. Venous Reflux:  None visualized. Other Findings:  None visualized. IMPRESSION: No definitive deep venous thrombosis is noted. Electronically Signed   By: Alcide CleverMark  Lukens M.D.   On: 02/18/2016 11:10   Dg Fluoro Guided Needle Plc Aspiration/injection Loc  Result Date: 02/18/2016 CLINICAL DATA:  Right shoulder pain. EXAM: RIGHT SHOULDER ASPIRATION UNDER FLUOROSCOPY FLUOROSCOPY TIME:  Radiation Exposure Index (as provided by the fluoroscopic device): 3.1 mGy PROCEDURE: Overlying skin prepped with ChloraPrep, draped in the usual sterile fashion, and infiltrated locally with buffered Lidocaine. Curved 20 gauge spinal needle advanced to the superolateral margin of the right humeral head. 1 ml of Isovue-300 injected easily. Diagnostic injection of iodinated contrast demonstrates intra-articular spread without intravascular component. Aspiration was attempted with no return of fluid. At this time 8 mL of sterile saline was injected into the right glenohumeral joint and subsequently aspirated. A total volume of 6 mL of the aspirated fluid was submitted for laboratory analysis. IMPRESSION: Technically successful right shoulder aspiration under fluoroscopy. Electronically Signed   By: Elige KoHetal  Patel   On: 02/18/2016 14:44    Positive ROS: All other systems have been reviewed and were otherwise negative with the exception of those mentioned in the HPI and as above.  Physical Exam: General: Alert, no acute distress  MUSCULOSKELETAL: Right shoulder:  Patient's skin is intact.  There is no erythema or ecchymosis or swelling. He has pain with attempted active abduction of 30. He has full digital range of motion in the right hand. He has intact sensation throughout the right upper extremity. Full wrist and elbow range of motion as well. Patient has point tenderness over his acromioclavicular joint. He has no tenderness over the sternoclavicular joint. There is no crepitus to  palpation of the humerus clavicle or scapula. He has no tenderness to palpation over the rotator cuff muscles and has only mild tenderness over the biceps tendon and rotator cuff insertion of the greater tuberosity. He can internally and neck symmetrical rotated his right shoulder with his arm by his side with mild pain.   Assessment: Right shoulder pain unlikely septic right shoulder joint.  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  Plan: I reviewed the right shoulder x-rays which demonstrate hypertrophy at the acromioclavicular joint consistent with arthritis. He is tender over the acromioclavicular joint as well which supports this diagnosis. Patient also felt improvement on Toradol which again supports an inflammatory etiology of his right shoulder pain. This may be successfully treated with nonsteroid anti-inflammatory or oral steroid taper.  I will defer to medicine and infectious disease whether or not an oral steroid is appropriate given his current antibiotic treatment for an abscess. Patient was unable to tolerate an MRI this evening.  Patient states he is going to try a larger bore MRI in the morning. The MRI test would be helpful and confirming a diagnosis. Ultrasound has been performed which was negative for DVT. Continue Toradol for now. I will reassess his shoulder tomorrow.  I have contacted Dr. Tonita CongWoodham from general surgery to let him know the patient has been admitted. He states that Gen. surgery service will see the patient. Another possibility for right shoulder pain can be diaphragmatic irritation and could possibly be related to an intra-abdominal process given his recent history.      Juanell FairlyKRASINSKI, Ardell Makarewicz, MD    02/18/2016 9:51 PM

## 2016-02-18 NOTE — ED Notes (Addendum)
Patient transported to MRI 

## 2016-02-18 NOTE — ED Provider Notes (Signed)
Joint aspiration fluid results are reassuring. Discussed with Dr. Martha ClanKrasinski who agrees with admission to medicine, MRI of shoulder, ID consultation and he will see the patient in the morning.   Danny Everyobert Jasaiah Karwowski, MD 02/18/16 [phone]

## 2016-02-18 NOTE — Progress Notes (Signed)
Pharmacy Antibiotic Note  Corky DownsSamuel V Requejo is a 55 y.o. male admitted on 02/18/2016 with intra abdominal infection.  Pharmacy has been consulted for levofloxacin dosing.  Plan: Levofloxacin 750 mg IV daily  Height: 5\' 6"  (167.6 cm) Weight: 250 lb (113.4 kg) IBW/kg (Calculated) : 63.8  Temp (24hrs), Avg:97.7 F (36.5 C), Min:97.7 F (36.5 C), Max:97.7 F (36.5 C)   Recent Labs Lab 02/12/16 0436 02/18/16 1141  WBC 6.0 11.6*  CREATININE  --  0.72    Estimated Creatinine Clearance: 121.9 mL/min (by C-G formula based on SCr of 0.8 mg/dL).    Allergies  Allergen Reactions  . Hydrocodone Itching and Other (See Comments)    Made him feel violent  . Meperidine Nausea And Vomiting and Other (See Comments)    Nausea and severe vomiting , talking out of head    Thank you for allowing pharmacy to be a part of this patient's care.  Jodelle RedMary M [facility] 02/18/2016 6:14 PM

## 2016-02-18 NOTE — ED Notes (Signed)
Taken to xray via stretcher.

## 2016-02-18 NOTE — ED Notes (Signed)
Pt has existing pic line in left arm that he is receiving antibiotics in at home.

## 2016-02-18 NOTE — ED Notes (Signed)
Bonita QuinLinda RN attempted to call report unsuccessful.

## 2016-02-18 NOTE — Progress Notes (Signed)
  Patient very well-known to the surgery service admitted today for right shoulder pain Patient has been admitted to surgery multiple times over the last month secondary to an intra-abdominal infection from a gangrenous cholecystitis that [facility] earlier this [facility]. Currently on outpatient IV antibiotics for this entity. Referr to recent admission with H&P dated August 8. Patient reports he awoke and from sleep with new onset right shoulder pain. He states his abdominal pain has been gone since he left the hospital last week. Currently admitted to medicine and being evaluated by orthopedics for possible septic shoulder joint.  On exam of his abdomen today his abdomen is completely benign. It is soft, nontender, nondistended with active bowel sounds. His PICC line is in his left upper extremity. His tenderness is in his right shoulder.  A/P: 55 year old male currently being treated with antibiotics for an intra-abdominal abscess now admitted with a possible septic right shoulder. MRI of the shoulders plan for the morning. Should the workup of the shoulder joint return negative would then consider reimaging his abdomen for possible intra-abdominal inflammation of the right diaphragm as a possible cause. Given his completely benign abdominal exam today this seems unlikely and rule out of shoulder pathology should be completed first. Surgery will continue to follow with you as this patient is well-known to our service.  Ricarda Frameharles Taiz Bickle, MD [facility] General Surgeon [facility]

## 2016-02-18 NOTE — Progress Notes (Signed)
See consult note

## 2016-02-19 ENCOUNTER — Encounter: Payer: BC Managed Care – PPO | Admitting: Surgery

## 2016-02-19 DIAGNOSIS — M2550 Pain in unspecified joint: Secondary | ICD-10-CM

## 2016-02-19 LAB — CBC
HCT: 32.7 % — ABNORMAL LOW (ref 40.0–52.0)
HEMOGLOBIN: 11.3 g/dL — AB (ref 13.0–18.0)
MCH: 28.6 pg (ref 26.0–34.0)
MCHC: 34.5 g/dL (ref 32.0–36.0)
MCV: 83 fL (ref 80.0–100.0)
PLATELETS: 335 10*3/uL (ref 150–440)
RBC: 3.93 MIL/uL — AB (ref 4.40–5.90)
RDW: 14.4 % (ref 11.5–14.5)
WBC: 8.2 10*3/uL (ref 3.8–10.6)

## 2016-02-19 LAB — BASIC METABOLIC PANEL
ANION GAP: 8 (ref 5–15)
BUN: 24 mg/dL — ABNORMAL HIGH (ref 6–20)
CALCIUM: 9.1 mg/dL (ref 8.9–10.3)
CO2: 26 mmol/L (ref 22–32)
Chloride: 103 mmol/L (ref 101–111)
Creatinine, Ser: 0.83 mg/dL (ref 0.61–1.24)
GLUCOSE: 100 mg/dL — AB (ref 65–99)
Potassium: 3.9 mmol/L (ref 3.5–5.1)
Sodium: 137 mmol/L (ref 135–145)

## 2016-02-19 MED ORDER — PREDNISONE 20 MG PO TABS
40.0000 mg | ORAL_TABLET | Freq: Every day | ORAL | Status: DC
  Filled 2016-02-19: qty 2

## 2016-02-19 MED ORDER — PREDNISONE 20 MG PO TABS: 20.0000 mg | ORAL_TABLET | Freq: Every day | ORAL | Status: DC

## 2016-02-19 MED ORDER — PIPERACILLIN-TAZOBACTAM 3.375 G IVPB
3.3750 g | Freq: Three times a day (TID) | INTRAVENOUS | Status: DC
  Filled 2016-02-19: qty 50

## 2016-02-19 MED ORDER — PREDNISONE 20 MG PO TABS: 30.0000 mg | ORAL_TABLET | Freq: Every day | ORAL | Status: DC

## 2016-02-19 MED ORDER — PREDNISONE 10 MG PO TABS: 10.0000 mg | ORAL_TABLET | Freq: Every day | ORAL | Status: DC

## 2016-02-19 MED ORDER — PREDNISONE 50 MG PO TABS
50.0000 mg | ORAL_TABLET | Freq: Every day | ORAL | Status: DC
  Administered 2016-02-19 – 2016-02-20 (×2): 50 mg via ORAL
  Filled 2016-02-19 (×2): qty 1

## 2016-02-19 MED ORDER — PIPERACILLIN-TAZOBACTAM 3.375 G IVPB
3.3750 g | Freq: Three times a day (TID) | INTRAVENOUS | Status: DC
  Administered 2016-02-19 – 2016-02-21 (×6): 3.375 g via INTRAVENOUS
  Filled 2016-02-19 (×8): qty 50

## 2016-02-19 NOTE — Progress Notes (Signed)
Subjective:   The events of his readmission have been noted. His right shoulder symptoms are improving. Multiple attempts at aspiration of been unsuccessful in removing any purulence. Gram stain and cultures to date have been negative.  He has less discomfort and more range of motion but still is markedly uncomfortable with his right shoulder. This problem is a new one to him.  Vital signs in last 24 hours: Temp:  [97.5 F (36.4 C)-98.9 F (37.2 C)] 97.5 F (36.4 C) (08/18 0740) Pulse Rate:  [68-91] 71 (08/18 1119) Resp:  [18-20] 20 (08/18 1119) BP: (115-144)/(63-95) 144/90 (08/18 1119) SpO2:  [95 %-100 %] 100 % (08/18 1119) Weight:  [110.1 kg (242 lb 11.2 oz)] 110.1 kg (242 lb 11.2 oz) (08/17 2114) Last BM Date: 02/18/16  Intake/Output from previous day: 08/17 0701 - 08/18 0700 In: -  Out: 300 [Urine:300]  Exam:  His abdominal exam is unremarkable. He has active bowel sounds no guarding no rebound no masses noted. He will not let me examine his shoulder.  Lab Results:  CBC  Recent Labs  02/18/16 1141 02/19/16 0717  WBC 11.6* 8.2  HGB 11.3* 11.3*  HCT 33.2* 32.7*  PLT 376 335   CMP     Component Value Date/Time   NA 137 02/19/2016 0717   NA 137 01/10/2013 1923   K 3.9 02/19/2016 0717   K 3.9 01/10/2013 1923   CL 103 02/19/2016 0717   CL 105 01/10/2013 1923   CO2 26 02/19/2016 0717   CO2 26 01/10/2013 1923   GLUCOSE 100 (H) 02/19/2016 0717   GLUCOSE 107 (H) 01/10/2013 1923   BUN 24 (H) 02/19/2016 0717   BUN 21 (H) 01/10/2013 1923   CREATININE 0.83 02/19/2016 0717   CREATININE 0.99 01/10/2013 1923   CALCIUM 9.1 02/19/2016 0717   CALCIUM 8.8 01/10/2013 1923   PROT 7.7 02/18/2016 1141   PROT 7.0 01/10/2013 1923   ALBUMIN 3.2 (L) 02/18/2016 1141   ALBUMIN 3.8 01/10/2013 1923   AST 34 02/18/2016 1141   AST 25 01/10/2013 1923   ALT 39 02/18/2016 1141   ALT 29 01/10/2013 1923   ALKPHOS 78 02/18/2016 1141   ALKPHOS 131 01/10/2013 1923   BILITOT 0.6 02/18/2016  1141   BILITOT 0.7 01/10/2013 1923   GFRNONAA >60 02/19/2016 0717   GFRNONAA >60 01/10/2013 1923   GFRAA >60 02/19/2016 0717   GFRAA >60 01/10/2013 1923   PT/INR No results for input(s): LABPROT, INR in the last 72 hours.  Studies/Results: Dg Shoulder Right  Result Date: 02/18/2016 CLINICAL DATA:  Pain EXAM: RIGHT SHOULDER - 2+ VIEW COMPARISON:  None. FINDINGS: Frontal, Y scapular, and axillary images were obtained. There is no acute fracture or dislocation. There is bony overgrowth in the acromioclavicular joint with intra-articular calcification in this area. The glenohumeral joint appears unremarkable. No erosive change. Visualized right lung is clear. 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 Question residua of old trauma in this area. No acute fracture or dislocation. Glenohumeral joint appears unremarkable. Electronically Signed   By: Bretta BangWilliam  Woodruff III M.D.   On: 02/18/2016 09:07   [place] Venous Img Upper Uni Right  Result Date: 02/18/2016 CLINICAL DATA:  Right shoulder pain for several hours EXAM: Right UPPER EXTREMITY VENOUS DOPPLER ULTRASOUND TECHNIQUE: Gray-scale sonography with graded compression, as well as color Doppler and duplex ultrasound were performed to evaluate the upper extremity deep venous system from the level of the subclavian vein and including the jugular, axillary,  basilic, radial, ulnar and upper cephalic vein. Spectral Doppler was utilized to evaluate flow at rest and with distal augmentation maneuvers. COMPARISON:  None. FINDINGS: Contralateral Subclavian Vein: Respiratory phasicity is normal and symmetric with the symptomatic side. No evidence of thrombus. Normal compressibility. Internal Jugular Vein: No evidence of thrombus. Normal compressibility, respiratory phasicity and response to augmentation. Subclavian Vein: No evidence of thrombus. Normal compressibility, respiratory phasicity and response to  augmentation. Axillary Vein: No evidence of thrombus. Normal compressibility, respiratory phasicity and response to augmentation. Cephalic Vein: No evidence of thrombus. Normal compressibility, respiratory phasicity and response to augmentation. Basilic Vein: No evidence of thrombus. Normal compressibility, respiratory phasicity and response to augmentation. Brachial Veins: No evidence of thrombus. Normal compressibility, respiratory phasicity and response to augmentation. Radial Veins: No evidence of thrombus. Normal compressibility, respiratory phasicity and response to augmentation. Ulnar Veins: No evidence of thrombus. Normal compressibility, respiratory phasicity and response to augmentation. Venous Reflux:  None visualized. Other Findings:  None visualized. IMPRESSION: No definitive deep venous thrombosis is noted. Electronically Signed   By: Alcide CleverMark  Lukens M.D.   On: 02/18/2016 11:10   Dg Fluoro Guided Needle Plc Aspiration/injection Loc  Result Date: 02/18/2016 CLINICAL DATA:  Right shoulder pain. EXAM: RIGHT SHOULDER ASPIRATION UNDER FLUOROSCOPY FLUOROSCOPY TIME:  Radiation Exposure Index (as provided by the fluoroscopic device): 3.1 mGy PROCEDURE: Overlying skin prepped with ChloraPrep, draped in the usual sterile fashion, and infiltrated locally with buffered Lidocaine. Curved 20 gauge spinal needle advanced to the superolateral margin of the right humeral head. 1 ml of Isovue-300 injected easily. Diagnostic injection of iodinated contrast demonstrates intra-articular spread without intravascular component. Aspiration was attempted with no return of fluid. At this time 8 mL of sterile saline was injected into the right glenohumeral joint and subsequently aspirated. A total volume of 6 mL of the aspirated fluid was submitted for laboratory analysis. IMPRESSION: Technically successful right shoulder aspiration under fluoroscopy. Electronically Signed   By: Elige KoHetal  Patel   On: 02/18/2016 14:44     Assessment/Plan: Social others not appear to be any significant purulence noted in the right shoulder joint. Saline injection and aspiration is now being examined but no infectious identified to date. His abdomen remains benign.  It has been 10 days since his last abdominal CT scan. He could have a subphrenic extension of this infection creating the right shoulder symptoms and if he does not demonstrate any obvious source of the right shoulder pain, we should probably repeat his CT scan of his abdomen. I discussed this plan with him in detail.

## 2016-02-19 NOTE — Care Management Note (Signed)
Case Management Note  Patient Details  Name: Danny Klein MRN: [id number] Date of Birth: 03/19/1961  Subjective/Objective:   Spoke with patient and spouse for discharge planning patient is from home and ambulates independently. NO DME.  Has not been driving due to illness. Is on [facility] with [facility] will need resumption orders at discharge. Patient was on Levaquin but  will possibly going home on Zosyn has PICC line .  Notified Feliberto GottronJason Hinton at [facility] . More to follow. Action/Plan: Home with Home Health.   Expected Discharge Date:                  Expected Discharge Plan:     In-House Referral:     Discharge planning Services  CM Consult  Post Acute Care Choice:    Choice offered to:  Patient  DME Arranged:   [name](IVABX) DME Agency:  [facility]  [facility]  Status of Service:  In process, will continue to follow  If discussed at Long Length of Stay Meetings, dates discussed:    Additional Comments:  Adonis HugueninBerkhead, Harshita Bernales L, RN 02/19/2016, 4:46 PM

## 2016-02-19 NOTE — Progress Notes (Signed)
Assessment completed at 0800. Assessment negative except for pain to r shoulder and reduced ROM to the arm. Pain rated at 2/10 if pt is lying still. Small bandage in place that pt stated was from attempted aspiration of the joint in the Er. Pt is reporting at 40-50# weight loss since April of this year. See flowsheet. Pt later received toradol ivp for pain via picc line intact to LUA, site is free of redness and swelling, no blood return noted when flushed. Wife at bedside. Srx2, call bell in reach.

## 2016-02-19 NOTE — Progress Notes (Signed)
Initial Nutrition Assessment  DOCUMENTATION CODES:   Severe malnutrition in context of chronic illness, Obesity unspecified  INTERVENTION:  1.  Supplements; patient has been drinking Boost Breeze at home.  Will hold per his request as an inpatient, but this can be ordered if patient would like.  2.  General healthful diet; discussed nutrition status with patient.  Encouraged weight maintenance/stablization.  Encouraged intake as able between diagnostics and procedures. Patient agreeable. He does feel his appetite and intake are improving and that he may be able to eat the amount of food needed to achieve this goal.    NUTRITION DIAGNOSIS:   Malnutrition related to altered GI function, poor appetite as evidenced by per patient/family report, percent weight loss, energy intake < 75% for > or equal to 3 months.   GOAL:   Patient will meet greater than or equal to 90% of their needs   MONITOR:   PO intake, Labs  REASON FOR ASSESSMENT:   Malnutrition Screening Tool    ASSESSMENT:   Patient with recurrent admission for GI-related pain/issues.  Patient underwent a complicated cholecystectomy in April and since then has had multiple admission for pain and management of phlegmon.  Patient was recently discharged with a PICC for chronic abx, however was readmitted wtih abdominal and now excruiating shoulder pain.  Patient reports he has lost 45 lbs in the past 4 months- since the onset of illness.  Based on review of illnesses and documentation, patient initially lost a severe amount of weight and has since been able to stablize his weight loss.  Patient was dx with severe malnutrition of acute illness, which is currently ongoing (now chronic) due to ongoing variable and, at times, poor intake.  RD discussed nutrition-related goals with patient who notes recent improvement in appetite.    Diet Order:  Diet 2 gram sodium Room service appropriate? Yes; Fluid consistency: Thin  Skin:   Reviewed, no issues  Last BM:  02/19/16  Height:   Ht Readings from Last 1 Encounters:  02/18/16 5\' 6"  (1.676 m)    Weight:   Wt Readings from Last 1 Encounters:  02/18/16 242 lb 11.2 oz (110.1 kg)    Ideal Body Weight:     BMI:  Body mass index is 39.17 kg/m.  Estimated Nutritional Needs:   Kcal:   1900-2100  Protein:   100-115g  Fluid:   >2.0 L/day  EDUCATION NEEDS:   Education needs addressed  Loyce DysKacie Kahmari Koller, MS RD LDN Clinical Inpatient Dietitian

## 2016-02-19 NOTE — Progress Notes (Signed)
Pharmacy Antibiotic Note  Danny Klein is a 55 y.o. male admitted on 02/18/2016 with intra abdominal infection and possible septic shoulder joint.  Pharmacy has been consulted for levofloxacin dosing. Patient has PICC line and has been receiving IV antibiotics since 8/8 (previously admitted with intra-abdominal infection, discharged on 8/13) for Dx of intra-abdominal infection.    Plan: Day 10 of Abx: Will continue Levofloxacin 750 mg IV daily Patient is also receiving Zosyn 3.375 Ib EI every 8 hours.   Height: 5\' 6"  (167.6 cm) Weight: 242 lb 11.2 oz (110.1 kg) IBW/kg (Calculated) : 63.8  Temp (24hrs), Avg:98.4 F (36.9 C), Min:97.5 F (36.4 C), Max:98.9 F (37.2 C)   Recent Labs Lab 02/18/16 1141 02/19/16 0717  WBC 11.6* 8.2  CREATININE 0.72 0.83    Estimated Creatinine Clearance: 115.7 mL/min (by C-G formula based on SCr of 0.83 mg/dL).    Allergies  Allergen Reactions  . Hydrocodone Itching and Other (See Comments)    Made him feel violent  . Meperidine Nausea And Vomiting and Other (See Comments)    Nausea and severe vomiting , talking out of head    Thank you for allowing pharmacy to be a part of this patient's care.  Cher NakaiSheema Lezlie Ritchey, PharmD Clinical Pharmacist 02/19/2016 9:23 AM

## 2016-02-19 NOTE — Progress Notes (Signed)
Pt received pain medication po at approx 1630 for c/o increasing pain to his shoulder. Since that time, pt has been able to sit on the edge of the bed and eat dinner, ambulate, and stated that he feels he has better range of motion now. PICC line remains intact. [facility] wife at bedside.

## 2016-02-19 NOTE — Progress Notes (Signed)
Infectious Disease Long Term IV Antibiotic Orders  Diagnosis: Abd abscess, R shoulder possible infection   Culture results Pending   Allergies:  Allergies  Allergen Reactions  . Hydrocodone Itching and Other (See Comments)    Made him feel violent  . Meperidine Nausea And Vomiting and Other (See Comments)    Nausea and severe vomiting , talking out of head     Discharge antibiotics Zosyn 3.375 grams every 8 hours   PICC Care per protocol Labs weekly while on IV antibiotics      CBC w diff   Comprehensive met panel ESR CRP    Planned duration of antibiotics 2-4 weeks  Stop date 03/16/16  Follow up clinic date TBD within 2 weeks  FAX weekly labs to [phone]  Leonel Ramsay, MD

## 2016-02-19 NOTE — Plan of Care (Signed)
Problem: Bowel/Gastric: Goal: Will not experience complications related to bowel motility Outcome: Progressing Pt has remained free of falls/injury this shift, verbalizes that he feels better.

## 2016-02-19 NOTE — Consult Note (Addendum)
[facility] Infectious Disease     Reason for Consult  Septic shoulder  Referring Physician: Gouru, A Date of Admission:  02/18/2016   Active Problems:   Septic joint of right shoulder region [facility])   HPI: OCTAVIUS SHIN is a 55 y.o. male admitted 8/17 with severe R shoulder pain. He has a complicated abdominal infection since cholecystectomy in April of 2017 for severe acute cholecystitis with focal mural necrosis  On path at [facility]. He has had multiple CT scans and an attempted aspiration 8/3 with cultures negative from that time. He has been on IV levofloxacin since his most recent admission her 8/8-8/13. His abd pain has actually improved and no fevers chills but yest awoke with severe R shoulder pain.  He had no injury with it.   On admit wbc 11.6, ESR 107.  Past Medical History:  Diagnosis Date  . Hypertension    Past Surgical History:  Procedure Laterality Date  . CARDIAC CATHETERIZATION  2009  . CHOLECYSTECTOMY    . SPINAL FUSION W/ LUQUE UNIT ROD  2013   Social History  Substance Use Topics  . Smoking status: Never Smoker  . Smokeless tobacco: Never Used  . Alcohol use 0.6 oz/week    1 Cans of beer per week     Comment: drinks occassionally; 1 can beer per week   Family History  Problem Relation Age of Onset  . Cancer Mother   . Heart disease Mother   . Heart disease Father     Allergies:  Allergies  Allergen Reactions  . Hydrocodone Itching and Other (See Comments)    Made him feel violent  . Meperidine Nausea And Vomiting and Other (See Comments)    Nausea and severe vomiting , talking out of head     Current antibiotics: Antibiotics Given (last 72 hours)    Date/Time Action Medication Dose Rate   02/18/16 2257 Given   piperacillin-tazobactam (ZOSYN) IVPB 3.375 g 3.375 g 12.5 mL/hr   02/19/16 7169 Given   piperacillin-tazobactam (ZOSYN) IVPB 3.375 g 3.375 g 12.5 mL/hr      MEDICATIONS: . docusate sodium  100 mg Oral BID  . enoxaparin (LOVENOX)  injection  40 mg Subcutaneous QHS  . fluticasone  1 spray Each Nare Daily  . levofloxacin  750 mg Intravenous Q24H  . lidocaine-EPINEPHrine  10 mL Other Once  . pantoprazole  40 mg Oral QAC breakfast  . predniSONE  50 mg Oral Q breakfast   And  . [START ON 02/20/2016] predniSONE  40 mg Oral Q breakfast   And  . [START ON 02/21/2016] predniSONE  30 mg Oral Q breakfast   And  . [START ON 02/22/2016] predniSONE  20 mg Oral Q breakfast   And  . [START ON 02/23/2016] predniSONE  10 mg Oral Q breakfast  . rosuvastatin  20 mg Oral Daily  . vitamin B-12  1,000 mcg Oral Daily    Review of Systems - 11 systems reviewed and negative per HPI   OBJECTIVE: Temp:  [97.5 F (36.4 C)-98.9 F (37.2 C)] 97.5 F (36.4 C) (08/18 0740) Pulse Rate:  [68-91] 71 (08/18 1119) Resp:  [18-20] 20 (08/18 1119) BP: (115-144)/(63-95) 144/90 (08/18 1119) SpO2:  [95 %-100 %] 100 % (08/18 1119) Weight:  [110.1 kg (242 lb 11.2 oz)] 110.1 kg (242 lb 11.2 oz) (08/17 2114) Physical Exam  Constitutional: He is oriented to person, place, and time.  No distress. Obese  HENT: anicteric Mouth/Throat: Oropharynx is clear and moist.  No oropharyngeal exudate.  Cardiovascular: Normal rate, regular rhythm and normal heart sounds. Distant Pulmonary/Chest: Effort normal and breath sounds normal. No respiratory distress. He has no wheezes.  Abdominal: Soft. Bowel sounds are normal. He exhibits no distension. There is no tenderness.  Lymphadenopathy: He has no cervical adenopathy.  Neurological: He is alert and oriented to person, place, and time.  Skin: Skin is warm and dry. No rash noted. No erythema.  Psychiatric: He has a normal mood and affect. His behavior is normal.  EXT R shoulder with pain limited by ROM   PICC LUE WNL  LABS: Results for orders placed or performed during the hospital encounter of 02/18/16 (from the past 48 hour(s))  CBC with Differential     Status: Abnormal   Collection Time: 02/18/16 11:41 AM   Result Value Ref Range   WBC 11.6 (H) 3.8 - 10.6 K/uL   RBC 3.97 (L) 4.40 - 5.90 MIL/uL   Hemoglobin 11.3 (L) 13.0 - 18.0 g/dL   HCT 33.2 (L) 40.0 - 52.0 %   MCV 83.6 80.0 - 100.0 fL   MCH 28.3 26.0 - 34.0 pg   MCHC 33.9 32.0 - 36.0 g/dL   RDW 14.3 11.5 - 14.5 %   Platelets 376 150 - 440 K/uL   Neutrophils Relative % 87 %   Neutro Abs 10.0 (H) 1.4 - 6.5 K/uL   Lymphocytes Relative 4 %   Lymphs Abs 0.5 (L) 1.0 - 3.6 K/uL   Monocytes Relative 8 %   Monocytes Absolute 1.0 0.2 - 1.0 K/uL   Eosinophils Relative 0 %   Eosinophils Absolute 0.0 0 - 0.7 K/uL   Basophils Relative 1 %   Basophils Absolute 0.1 0 - 0.1 K/uL  Comprehensive metabolic panel     Status: Abnormal   Collection Time: 02/18/16 11:41 AM  Result Value Ref Range   Sodium 139 135 - 145 mmol/L   Potassium 4.0 3.5 - 5.1 mmol/L   Chloride 107 101 - 111 mmol/L   CO2 23 22 - 32 mmol/L   Glucose, Bld 111 (H) 65 - 99 mg/dL   BUN 19 6 - 20 mg/dL   Creatinine, Ser 0.72 0.61 - 1.24 mg/dL   Calcium 9.1 8.9 - 10.3 mg/dL   Total Protein 7.7 6.5 - 8.1 g/dL   Albumin 3.2 (L) 3.5 - 5.0 g/dL   AST 34 15 - 41 U/L   ALT 39 17 - 63 U/L   Alkaline Phosphatase 78 38 - 126 U/L   Total Bilirubin 0.6 0.3 - 1.2 mg/dL   GFR calc non Af Amer >60 >60 mL/min   GFR calc Af Amer >60 >60 mL/min    Comment: (NOTE) The eGFR has been calculated using the CKD EPI equation. This calculation has not been validated in all clinical situations. eGFR's persistently <60 mL/min signify possible Chronic Kidney Disease.    Anion gap 9 5 - 15  Sedimentation rate     Status: Abnormal   Collection Time: 02/18/16 11:41 AM  Result Value Ref Range   Sed Rate 107 (H) 0 - 20 mm/hr  Synovial cell count + diff, w/ crystals     Status: Abnormal   Collection Time: 02/18/16  2:37 PM  Result Value Ref Range   Color, Synovial COLORLESS (A) YELLOW   Appearance-Synovial CLEAR CLEAR   Crystals, Fluid NO CRYSTALS SEEN    WBC, Synovial 346 (H) 0 - 200 /cu mm    Neutrophil, Synovial 9 %   Lymphocytes-Synovial Fld  80 %   Monocyte-Macrophage-Synovial Fluid 11 %   Eosinophils-Synovial 0 %   Other Cells-SYN 0   Body fluid culture     Status: None (Preliminary result)   Collection Time: 02/18/16  2:37 PM  Result Value Ref Range   Specimen Description SYNOVIAL RIGHT SHOULDER    Special Requests NONE    Gram Stain      RARE WBC PRESENT,BOTH PMN AND MONONUCLEAR NO ORGANISMS SEEN    Culture      NO GROWTH < 24 HOURS Performed at [facility]    Report Status PENDING   Basic metabolic panel     Status: Abnormal   Collection Time: 02/19/16  7:17 AM  Result Value Ref Range   Sodium 137 135 - 145 mmol/L   Potassium 3.9 3.5 - 5.1 mmol/L   Chloride 103 101 - 111 mmol/L   CO2 26 22 - 32 mmol/L   Glucose, Bld 100 (H) 65 - 99 mg/dL   BUN 24 (H) 6 - 20 mg/dL   Creatinine, Ser 0.83 0.61 - 1.24 mg/dL   Calcium 9.1 8.9 - 10.3 mg/dL   GFR calc non Af Amer >60 >60 mL/min   GFR calc Af Amer >60 >60 mL/min    Comment: (NOTE) The eGFR has been calculated using the CKD EPI equation. This calculation has not been validated in all clinical situations. eGFR's persistently <60 mL/min signify possible Chronic Kidney Disease.    Anion gap 8 5 - 15  CBC     Status: Abnormal   Collection Time: 02/19/16  7:17 AM  Result Value Ref Range   WBC 8.2 3.8 - 10.6 K/uL   RBC 3.93 (L) 4.40 - 5.90 MIL/uL   Hemoglobin 11.3 (L) 13.0 - 18.0 g/dL   HCT 32.7 (L) 40.0 - 52.0 %   MCV 83.0 80.0 - 100.0 fL   MCH 28.6 26.0 - 34.0 pg   MCHC 34.5 32.0 - 36.0 g/dL   RDW 14.4 11.5 - 14.5 %   Platelets 335 150 - 440 K/uL   No components found for: ESR, C REACTIVE PROTEIN MICRO: Recent Results (from the past 720 hour(s))  Aerobic/Anaerobic Culture (surgical/deep wound)     Status: None   Collection Time: 02/04/16  2:50 PM  Result Value Ref Range Status   Specimen Description ABDOMEN ABSCESS  Final   Special Requests NONE  Final   Gram Stain   Final    RARE WBC PRESENT,  PREDOMINANTLY PMN NO ORGANISMS SEEN    Culture   Final    No growth aerobically or anaerobically. Performed at [facility]    Report Status 02/09/2016 FINAL  Final  Body fluid culture     Status: None (Preliminary result)   Collection Time: 02/18/16  2:37 PM  Result Value Ref Range Status   Specimen Description SYNOVIAL RIGHT SHOULDER  Final   Special Requests NONE  Final   Gram Stain   Final    RARE WBC PRESENT,BOTH PMN AND MONONUCLEAR NO ORGANISMS SEEN    Culture   Final    NO GROWTH < 24 HOURS Performed at [facility]    Report Status PENDING  Incomplete    IMAGING: Dg Shoulder Right  Result Date: 02/18/2016 CLINICAL DATA:  Pain EXAM: RIGHT SHOULDER - 2+ VIEW COMPARISON:  None. FINDINGS: Frontal, Y scapular, and axillary images were obtained. There is no acute fracture or dislocation. There is bony overgrowth in the acromioclavicular joint with intra-articular calcification in this area.  The glenohumeral joint appears unremarkable. No erosive change. Visualized right lung is clear. 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 Question residua of old trauma in this area. No acute fracture or dislocation. Glenohumeral joint appears unremarkable. Electronically Signed   By: Lowella Grip III M.D.   On: 02/18/2016 09:07   Ct Abdomen Pelvis W Contrast  Result Date: 02/09/2016 CLINICAL DATA:  Abdominal pain.  Status post liver abscess drainage. EXAM: CT ABDOMEN AND PELVIS WITH CONTRAST TECHNIQUE: Multidetector CT imaging of the abdomen and pelvis was performed using the standard protocol following bolus administration of intravenous contrast. CONTRAST:  163m ISOVUE-300 IOPAMIDOL (ISOVUE-300) INJECTION 61% COMPARISON:  01/29/2016 FINDINGS: Lower chest and abdominal wall: Fatty enlargement inguinal canals which may be hernia. Chronic cardiomegaly.  Mild basilar atelectasis Hepatobiliary: Persistent ill-defined soft  tissue inferior to the right liver tip with wispy appearance and probable enhancement but no rim enhancing fluid collection for drainage. Adjacent inferior tip of the liver appears edematous.Cholecystectomy. No recurrence of fluid collection in the gallbladder fossa where there was rim enhancing collection 10/29/2015. Pancreas: Unremarkable. Spleen: Unremarkable. Adrenals/Urinary Tract: Negative adrenals. Postoperative changes around the lower pole left kidney are stable. No hydronephrosis or stone. Otherwise symmetric renal enhancement. Unremarkable bladder. Stomach/Bowel: No obstruction. No appendicitis. Distal colonic diverticulosis. Reproductive:No pathologic findings. Vascular/Lymphatic: No acute vascular abnormality. No mass or adenopathy. Other: No ascites or pneumoperitoneum. Musculoskeletal: No acute abnormalities. Disc degeneration. L4-5 posterior lateral solid bony fusion. IMPRESSION: Persistent right subhepatic inflammation where small abscess was recently aspirated. No mature collection seen today. 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 Electronically Signed   By: JMonte FantasiaM.D.   On: 02/09/2016 13:25   Ct Abdomen Pelvis W Contrast  Result Date: 01/29/2016 CLINICAL DATA:  Abdominal pain, nausea and vomiting with fever of up to 102. Status postcholecystectomy with postsurgical collection treated conservatively. EXAM: CT ABDOMEN AND PELVIS WITH CONTRAST TECHNIQUE: Multidetector CT imaging of the abdomen and pelvis was performed using the standard protocol following bolus administration of intravenous contrast. CONTRAST:  Not given. COMPARISON:  01/22/2016 FINDINGS: Lower chest:  No acute findings. Hepatobiliary: There is a 6.3 x 2.6 x 4.0 cm inflammatory collection which abuts they inferior tip of the right lobe of the liver with small amount of subcapsular extension. There is adjacent inflammatory fat stranding. Patient is status post cholecystectomy with no  fluid or inflammatory changes within the gallbladder fossa. Pancreas: No mass, inflammatory changes, or other significant abnormality. Spleen: Within normal limits in size and appearance. Adrenals/Urinary Tract: No masses identified. No evidence of hydronephrosis. Punctate calcifications along the posterior capsule of the left kidney are stable, possibly posttraumatic. Stomach/Bowel: No evidence of obstruction, inflammatory process, or abnormal fluid collections. Small duodenal diverticulum off of the third portion of the duodenum is seen. The appendix is normal. Vascular/Lymphatic: No pathologically enlarged lymph nodes. No evidence of abdominal aortic aneurysm. Mild atherosclerotic disease of the aorta. Reproductive: No mass or other significant abnormality. Other: None. Musculoskeletal: No suspicious bone lesions identified. Spinal fusion is stable. IMPRESSION: Slight worsening of the inflammatory/infectious right retroperitoneal/intra-abdominal collection, which communicates with the inferior most tip of the right lobe of the liver with small subcapsular extension. Associated tenting of the lateral abdominal wall suggest relatively longstanding process. There is no drainable fluid portion of this collection. Differential diagnosis includes persistent postsurgical collection versus hepatic/perihepatic abscess versus less likely focal mesenteric/retroperitoneal fat infarct. Electronically Signed   By: DFidela SalisburyM.D.   On: 01/29/2016 13:44  Ct Abdomen Pelvis W Contrast  Result Date: 01/22/2016 CLINICAL DATA:  Pain since May of 2017. An MRI from April of 2017 demonstrated changes from cholecystectomy with possible biloma or abscess in the gallbladder fossa. EXAM: CT ABDOMEN AND PELVIS WITH CONTRAST TECHNIQUE: Multidetector CT imaging of the abdomen and pelvis was performed using the standard protocol following bolus administration of intravenous contrast. CONTRAST:  132m ISOVUE-300 IOPAMIDOL  (ISOVUE-300) INJECTION 61% COMPARISON:  MRI October 29, 2015 and CT scan January 10, 2013 FINDINGS: Mild atelectasis in the left lung base. There is contrast in the distal esophagus consistent with reflux. The lung bases are otherwise within normal limits. No free air. There is a mixed attenuation process just inferior to the liver. There is slight low-attenuation in the inferior liver which may be secondary to this process. There is intermixed fat throughout the process as well as adjacent stranding. The process is likely inflammatory measuring 4.4 x 6.7 cm on coronal image 109. This process was not present on the recent MRI i and n the adjacent liver was normal in appearance at that time. The collection in the gallbladder fossa seen on the previous MRI has resolved. The gallbladder is surgically absent. The remainder of the liver is normal in appearance. The portal vein, spleen, adrenal glands, pancreas, and right kidney are normal. A few cysts an peripheral calcifications are seen in the left kidney, unchanged, of no acute significance. Atherosclerosis is seen in the non aneurysmal aorta. No adenopathy. The stomach is normal in appearance. At least 1 small duodenal diverticulum is identified. The remainder of the small bowel is normal. Colonic diverticulosis is seen with no diverticulitis. The appendix is well seen and normal. The pelvis demonstrates no adenopathy or mass. The bladder is normal. No other acute abnormalities. Pedicle rods and screws are seen in the lower lumbar spine. No other acute bony abnormalities. IMPRESSION: 1. 4.4 x 6.7 cm process centered inferior to the liver and abutting the inferior tip of the liver. The process appears to be inflammatory with infection considered less likely. An underlying cause is not identified. A delayed bile leak is considered unlikely due to the appearance and timing of the process. A dropped stones can sometimes be seen adjacent to the inferior liver with intense  inflammatory reaction but no definitive stones are seen. There is no clear cause for the abnormality identified. 2. Atherosclerosis in the abdominal aorta. Electronically Signed   By: DDorise BullionIII M.D   On: 01/22/2016 16:56   UKoreaVenous Img Upper Uni Right  Result Date: 02/18/2016 CLINICAL DATA:  Right shoulder pain for several hours EXAM: Right UPPER EXTREMITY VENOUS DOPPLER ULTRASOUND TECHNIQUE: Gray-scale sonography with graded compression, as well as color Doppler and duplex ultrasound were performed to evaluate the upper extremity deep venous system from the level of the subclavian vein and including the jugular, axillary, basilic, radial, ulnar and upper cephalic vein. Spectral Doppler was utilized to evaluate flow at rest and with distal augmentation maneuvers. COMPARISON:  None. FINDINGS: Contralateral Subclavian Vein: Respiratory phasicity is normal and symmetric with the symptomatic side. No evidence of thrombus. Normal compressibility. Internal Jugular Vein: No evidence of thrombus. Normal compressibility, respiratory phasicity and response to augmentation. Subclavian Vein: No evidence of thrombus. Normal compressibility, respiratory phasicity and response to augmentation. Axillary Vein: No evidence of thrombus. Normal compressibility, respiratory phasicity and response to augmentation. Cephalic Vein: No evidence of thrombus. Normal compressibility, respiratory phasicity and response to augmentation. Basilic Vein: No evidence of thrombus. Normal compressibility,  respiratory phasicity and response to augmentation. Brachial Veins: No evidence of thrombus. Normal compressibility, respiratory phasicity and response to augmentation. Radial Veins: No evidence of thrombus. Normal compressibility, respiratory phasicity and response to augmentation. Ulnar Veins: No evidence of thrombus. Normal compressibility, respiratory phasicity and response to augmentation. Venous Reflux:  None visualized. Other  Findings:  None visualized. IMPRESSION: No definitive deep venous thrombosis is noted. Electronically Signed   By: Inez Catalina M.D.   On: 02/18/2016 11:10   Dg Chest Port 1 View  Result Date: 02/12/2016 CLINICAL DATA:  Check PICC line placement EXAM: PORTABLE CHEST 1 VIEW COMPARISON:  11/22/2005 FINDINGS: Cardiac shadow is mildly enlarged. A left-sided PICC line is noted with catheter tip at the cavoatrial junction in satisfactory position. The lungs are clear. No bony abnormality is noted. IMPRESSION: PICC line in satisfactory position. Electronically Signed   By: Inez Catalina M.D.   On: 02/12/2016 11:49   Dg Fluoro Guided Needle Plc Aspiration/injection Loc  Result Date: 02/18/2016 CLINICAL DATA:  Right shoulder pain. EXAM: RIGHT SHOULDER ASPIRATION UNDER FLUOROSCOPY FLUOROSCOPY TIME:  Radiation Exposure Index (as provided by the fluoroscopic device): 3.1 mGy PROCEDURE: Overlying skin prepped with ChloraPrep, draped in the usual sterile fashion, and infiltrated locally with buffered Lidocaine. Curved 20 gauge spinal needle advanced to the superolateral margin of the right humeral head. 1 ml of Isovue-300 injected easily. Diagnostic injection of iodinated contrast demonstrates intra-articular spread without intravascular component. Aspiration was attempted with no return of fluid. At this time 8 mL of sterile saline was injected into the right glenohumeral joint and subsequently aspirated. A total volume of 6 mL of the aspirated fluid was submitted for laboratory analysis. IMPRESSION: Technically successful right shoulder aspiration under fluoroscopy. Electronically Signed   By: Kathreen Devoid   On: 02/18/2016 14:44   Ct Image Guided Drainage By Percutaneous Catheter  Result Date: 02/04/2016 INDICATION: 55 year old with prior cholecystectomy. Patient has had persistent pain and discomfort in the right flank since the surgery. Patient has a suspicious inflammatory collection or abscess in the right abdomen  inferior to the liver. Request for drainage or aspiration. EXAM: CT GUIDED ASPIRATION OF RIGHT ABDOMINAL FLUID COLLECTION MEDICATIONS: The patient is currently admitted to the hospital and receiving antibiotics. ANESTHESIA/SEDATION: 2.0 mg IV Versed 50 mcg IV Fentanyl Moderate Sedation Time:  18 minutes The patient was continuously monitored during the procedure by the interventional radiology nurse under my direct supervision. COMPLICATIONS: None immediate. TECHNIQUE: Informed written consent was obtained from the patient after a thorough discussion of the procedural risks, benefits and alternatives. All questions were addressed. Maximal Sterile Barrier Technique was utilized including mask, sterile gowns, sterile gloves, sterile drape, hand hygiene and skin antiseptic. A timeout was performed prior to the initiation of the procedure. PROCEDURE: Patient was placed prone. The right flank was prepped with chlorhexidine in a sterile fashion, and a sterile drape was applied covering the operative field. A sterile gown and sterile gloves were used for the procedure. Local anesthesia was provided with 1% Lidocaine. 49 gauge trocar needle was directed into the right posterior abdominal collection with CT guidance. Needle placement was confirmed within the collection but unable to aspirate fluid. The needle was repositioned two times. Unable to aspirate any fluid with the needle in the collections. However, as the needle was withdrawn, a few drops of yellow purulent fluid was collected. Bandage placed over the puncture site. FINDINGS: Complex irregular low-density collections in the right posterior abdomen, inferior to the liver. Needle was directed into  these collections. Only a few drops of purulent fluid could be aspirated. IMPRESSION: Successful CT-guided aspiration of the small irregular collection in the right posterior abdomen. Findings compatible with a tiny abscess. Fluid was sent for culture. Electronically  Signed   By: Markus Daft M.D.   On: 02/04/2016 17:25    Assessment:   QUY LOTTS is a 55 y.o. male with complicated abd infection history following cholecystitis in April, on otpt IV levofloxacin now with severe R shoulder pain. His abd pain is improved, wbc is up some, esr is elevated. Had aspiration of R shoulder with min fluid obtained.  I am worried he may have a severe tendinopathy from Levofloxacin, although a septic shoulder is also possible given the recent abd infection or the PICC line in place.  Recommendations Continue zosyn IV for 2-4 week Check bcx x 2 Avoid quinolones  I have written orders for outpt IV abx and can follow him  Spoke with Dr Mack Guise and he will plan on an MRI as otpt since needs open MRI Also agree with Dr Rolin Barry suggestion to consider CT abd to see if this is referred pain.  Thank you very much for allowing me to participate in the care of this patient. Please call with questions.   Cheral Marker. Ola Spurr, MD

## 2016-02-19 NOTE — Progress Notes (Signed)
Subjective:  Patient reports pain as moderate, but modestly improved since I saw him last night. Patient states he has experienced modest improvement in range of motion as well. Patient denies fever or chills overnight. He has no additional areas of pain.  Objective:   VITALS:   Vitals:   02/18/16 2114 02/19/16 0518 02/19/16 0740 02/19/16 1119  BP:  117/73 115/67 (!) 144/90  Pulse:  68 72 71  Resp:  20 18 20   Temp:  98 F (36.7 C) 97.5 F (36.4 C)   TempSrc:  Oral Oral   SpO2:  97% 100% 100%  Weight: 110.1 kg (242 lb 11.2 oz)     Height:        PHYSICAL EXAM:  Right shoulder: Patient has no obvious swelling erythema or ecchymosis. He still has tenderness in the area of the acromioclavicular joint.Marland Kitchen.  He can forward flex and abduct to approximately 40-45 today. He can internally and externally rotate his right shoulder with his arm by his side without significant pain. He can actively flex and extend his digits, wrist and elbow without pain.   LABS  Results for orders placed or performed during the hospital encounter of 02/18/16 (from the past 24 hour(s))  Synovial cell count + diff, w/ crystals     Status: Abnormal   Collection Time: 02/18/16  2:37 PM  Result Value Ref Range   Color, Synovial COLORLESS (A) YELLOW   Appearance-Synovial CLEAR CLEAR   Crystals, Fluid NO CRYSTALS SEEN    WBC, Synovial 346 (H) 0 - 200 /cu mm   Neutrophil, Synovial 9 %   Lymphocytes-Synovial Fld 80 %   Monocyte-Macrophage-Synovial Fluid 11 %   Eosinophils-Synovial 0 %   Other Cells-SYN 0   Body fluid culture     Status: None (Preliminary result)   Collection Time: 02/18/16  2:37 PM  Result Value Ref Range   Specimen Description SYNOVIAL RIGHT SHOULDER    Special Requests NONE    Gram Stain      RARE WBC PRESENT,BOTH PMN AND MONONUCLEAR NO ORGANISMS SEEN    Culture      NO GROWTH < 24 HOURS [facility]    Report Status PENDING   Basic metabolic panel     Status:  Abnormal   Collection Time: 02/19/16  7:17 AM  Result Value Ref Range   Sodium 137 135 - 145 mmol/L   Potassium 3.9 3.5 - 5.1 mmol/L   Chloride 103 101 - 111 mmol/L   CO2 26 22 - 32 mmol/L   Glucose, Bld 100 (H) 65 - 99 mg/dL   BUN 24 (H) 6 - 20 mg/dL   Creatinine, Ser 1.610.83 0.61 - 1.24 mg/dL   Calcium 9.1 8.9 - 09.610.3 mg/dL   GFR calc non Af Amer >60 >60 mL/min   GFR calc Af Amer >60 >60 mL/min   Anion gap 8 5 - 15  CBC     Status: Abnormal   Collection Time: 02/19/16  7:17 AM  Result Value Ref Range   WBC 8.2 3.8 - 10.6 K/uL   RBC 3.93 (L) 4.40 - 5.90 MIL/uL   Hemoglobin 11.3 (L) 13.0 - 18.0 g/dL   HCT 04.532.7 (L) 40.940.0 - 81.152.0 %   MCV 83.0 80.0 - 100.0 fL   MCH 28.6 26.0 - 34.0 pg   MCHC 34.5 32.0 - 36.0 g/dL   RDW 91.414.4 78.211.5 - 95.614.5 %   Platelets 335 150 - 440 K/uL    Dg Shoulder  Right  Result Date: 02/18/2016 CLINICAL DATA:  Pain EXAM: RIGHT SHOULDER - 2+ VIEW COMPARISON:  None. FINDINGS: Frontal, Y scapular, and axillary images were obtained. There is no acute fracture or dislocation. There is bony overgrowth in the acromioclavicular joint with intra-articular calcification in this area. The glenohumeral joint appears unremarkable. No erosive change. Visualized right lung is clear. 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 Question residua of old trauma in this area. No acute fracture or dislocation. Glenohumeral joint appears unremarkable. Electronically Signed   By: Bretta Bang III M.D.   On: 02/18/2016 09:07   US Venous Img Upper Uni Right  Result Date: 02/18/2016 CLINICAL DATA:  Right shoulder pain for several hours EXAM: Right UPPER EXTREMITY VENOUS DOPPLER ULTRASOUND TECHNIQUE: Gray-scale sonography with graded compression, as well as color Doppler and duplex ultrasound were performed to evaluate the upper extremity deep venous system from the level of the subclavian vein and including the jugular, axillary, basilic,  radial, ulnar and upper cephalic vein. Spectral Doppler was utilized to evaluate flow at rest and with distal augmentation maneuvers. COMPARISON:  None. FINDINGS: Contralateral Subclavian Vein: Respiratory phasicity is normal and symmetric with the symptomatic side. No evidence of thrombus. Normal compressibility. Internal Jugular Vein: No evidence of thrombus. Normal compressibility, respiratory phasicity and response to augmentation. Subclavian Vein: No evidence of thrombus. Normal compressibility, respiratory phasicity and response to augmentation. Axillary Vein: No evidence of thrombus. Normal compressibility, respiratory phasicity and response to augmentation. Cephalic Vein: No evidence of thrombus. Normal compressibility, respiratory phasicity and response to augmentation. Basilic Vein: No evidence of thrombus. Normal compressibility, respiratory phasicity and response to augmentation. Brachial Veins: No evidence of thrombus. Normal compressibility, respiratory phasicity and response to augmentation. Radial Veins: No evidence of thrombus. Normal compressibility, respiratory phasicity and response to augmentation. Ulnar Veins: No evidence of thrombus. Normal compressibility, respiratory phasicity and response to augmentation. Venous Reflux:  None visualized. Other Findings:  None visualized. IMPRESSION: No definitive deep venous thrombosis is noted. Electronically Signed   By: Alcide Clever M.D.   On: 02/18/2016 11:10   Dg Fluoro Guided Needle Plc Aspiration/injection Loc  Result Date: 02/18/2016 CLINICAL DATA:  Right shoulder pain. EXAM: RIGHT SHOULDER ASPIRATION UNDER FLUOROSCOPY FLUOROSCOPY TIME:  Radiation Exposure Index (as provided by the fluoroscopic device): 3.1 mGy PROCEDURE: Overlying skin prepped with ChloraPrep, draped in the usual sterile fashion, and infiltrated locally with buffered Lidocaine. Curved 20 gauge spinal needle advanced to the superolateral margin of the right humeral head. 1 ml of  Isovue-300 injected easily. Diagnostic injection of iodinated contrast demonstrates intra-articular spread without intravascular component. Aspiration was attempted with no return of fluid. At this time 8 mL of sterile saline was injected into the right glenohumeral joint and subsequently aspirated. A total volume of 6 mL of the aspirated fluid was submitted for laboratory analysis. IMPRESSION: Technically successful right shoulder aspiration under fluoroscopy. Electronically Signed   By: Elige Ko   On: 02/18/2016 14:44    Assessment/Plan:     Active Problems:   Septic joint of right shoulder region [facility])  Spoke with Dr. Cherlynn Kaiser regarding this patient today.  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 Patient was not able to tolerate an MRI today. He is seeing improvement on Toradol.  I suggested that the patient try an oral steroid taper.  Dr. Cherlynn Kaiser confirmed with Dr. Michela Pitcher regarding the use of steroids and will order this  medication for the patient. Cultures from the right shoulder glenohumeral joint are negative to date. Patient's white count is 8.2.  He is afebrile with stable vital signs.  I will continue to follow the patient.     Juanell FairlyKRASINSKI, Marelly Wehrman , MD 02/19/2016, 1:18 PM

## 2016-02-19 NOTE — Progress Notes (Signed)
Pharmacy Antibiotic Note  Danny Klein is a 55 y.o. male admitted on 02/18/2016 with intra abdominal infection and possible septic shoulder joint.  Patient has PICC line and has been receiving Levofloxacin IV antibiotics since 8/8 (previously admitted with intra-abdominal infection, discharged on 8/13). Pharmacy now consulted for Zosyn dosing for Abd abscess and R shoulder possible infection.   8/18 Levofloxacin has been discontinued by ID.   Plan: Continue Zosyn 3.375 IV EI every 8 hours.  .   Height: 5\' 6"  (167.6 cm) Weight: 242 lb 11.2 oz (110.1 kg) IBW/kg (Calculated) : 63.8  Temp (24hrs), Avg:98.4 F (36.9 C), Min:97.5 F (36.4 C), Max:98.9 F (37.2 C)   Recent Labs Lab 02/18/16 1141 02/19/16 0717  WBC 11.6* 8.2  CREATININE 0.72 0.83    Estimated Creatinine Clearance: 115.7 mL/min (by C-G formula based on SCr of 0.83 mg/dL).    Allergies  Allergen Reactions  . Hydrocodone Itching and Other (See Comments)    Made him feel violent  . Meperidine Nausea And Vomiting and Other (See Comments)    Nausea and severe vomiting , talking out of head    Thank you for allowing pharmacy to be a part of this patient's care.  Cher NakaiSheema Chip Canepa, PharmD Clinical Pharmacist 02/19/2016 2:47 PM

## 2016-02-20 DIAGNOSIS — E43 Unspecified severe protein-calorie malnutrition: Secondary | ICD-10-CM | POA: Insufficient documentation

## 2016-02-20 MED ORDER — ALTEPLASE 2 MG IJ SOLR
2.0000 mg | Freq: Once | INTRAMUSCULAR | Status: DC
  Filled 2016-02-20 (×2): qty 2

## 2016-02-20 MED ORDER — PREDNISONE 20 MG PO TABS: 30.0000 mg | ORAL_TABLET | Freq: Every day | ORAL | Status: DC

## 2016-02-20 MED ORDER — PREDNISONE 5 MG PO TABS: ORAL_TABLET | ORAL | 0 refills | Status: DC

## 2016-02-20 MED ORDER — PREDNISONE 10 MG PO TABS
10.0000 mg | ORAL_TABLET | Freq: Every day | ORAL | Status: DC
Start: 2016-02-23 — End: 2016-02-21

## 2016-02-20 MED ORDER — PIPERACILLIN-TAZOBACTAM 3.375 G IVPB: 3.3750 g | Freq: Three times a day (TID) | INTRAVENOUS | 0 refills | Status: DC

## 2016-02-20 MED ORDER — PREDNISONE 20 MG PO TABS: 20.0000 mg | ORAL_TABLET | Freq: Every day | ORAL | Status: DC

## 2016-02-20 MED ORDER — PREDNISONE 20 MG PO TABS
40.0000 mg | ORAL_TABLET | Freq: Every day | ORAL | Status: DC
  Administered 2016-02-21: 40 mg via ORAL
  Filled 2016-02-20: qty 2

## 2016-02-20 MED ORDER — PREDNISONE 50 MG PO TABS: 50.0000 mg | ORAL_TABLET | Freq: Every day | ORAL | Status: DC

## 2016-02-20 NOTE — Care Management Note (Signed)
Case Management Note  Patient Details  Name: Danny Klein MRN: [id number] Date of Birth: 07/01/1961  Subjective/Objective:   Discussed discharge planning with Dr Renae GlossWieting. Danny Klein will be discharged home tomorrow with IV Zosyn TID x 14 days. The current IV Zosyn order was faxed to the [facility] pharmacy today for delivery to the home tomorrow. . Per [facility], Danny Klein is already open to [facility] RN services.                   Action/Plan:   Expected Discharge Date:                  Expected Discharge Plan:     In-House Referral:     Discharge planning Services  CM Consult  Post Acute Care Choice:    Choice offered to:  Patient  DME Arranged:   [name](IVABX) DME Agency:  [facility]  [facility]  Status of Service:  In process, will continue to follow  If discussed at Long Length of Stay Meetings, dates discussed:    Additional Comments:  Priscilla Kirstein A, RN 02/20/2016, 2:55 PM

## 2016-02-20 NOTE — Progress Notes (Signed)
Subjective:  Patient was seen initially walked around the nurse's station. I then evaluated to him in his room. His wife is at the bedside. Patient states that his right shoulder pain is dramatically improved.   Objective:   VITALS:   Vitals:   02/19/16 1119 02/19/16 1931 02/20/16 0412 02/20/16 0926  BP: (!) 144/90 126/74 121/75 121/67  Pulse: 71 83 63 76  Resp: 20 18 20    Temp:  98.8 F (37.1 C) 97.5 F (36.4 C) 97.7 F (36.5 C)  TempSrc:  Oral Oral Oral  SpO2: 100% 99% 100% 97%  Weight:      Height:        PHYSICAL EXAM:  Right shoulder: Patient's skin is intact. There is no erythema or ecchymosis or swelling. He can forward elevate to approximately 120 today. He has full digital wrist and elbow range of motion. Patient has rotation in abduction of the right shoulder without pain. He remains neurovascularly intact.   LABS  Results for orders placed or performed during the hospital encounter of 02/18/16 (from the past 24 hour(s))  Culture, blood (Routine X 2) w Reflex to ID Panel     Status: None (Preliminary result)   Collection Time: 02/19/16  2:55 PM  Result Value Ref Range   Specimen Description BLOOD RIGHT ASSIST CONTROL    Special Requests 7 CC AEROBIC, 10 CC ANAEROBIC    Culture NO GROWTH < 24 HOURS    Report Status PENDING   Culture, blood (Routine X 2) w Reflex to ID Panel     Status: None (Preliminary result)   Collection Time: 02/19/16  2:55 PM  Result Value Ref Range   Specimen Description BLOOD LEFT HAND    Special Requests BOTTLES DRAWN AEROBIC AND ANAEROBIC 1CC    Culture NO GROWTH < 24 HOURS    Report Status PENDING     Dg Fluoro Guided Needle Plc Aspiration/injection Loc  Result Date: 02/18/2016 CLINICAL DATA:  Right shoulder pain. EXAM: RIGHT SHOULDER ASPIRATION UNDER FLUOROSCOPY FLUOROSCOPY TIME:  Radiation Exposure Index (as provided by the fluoroscopic device): 3.1 mGy PROCEDURE: Overlying skin prepped with ChloraPrep, draped in the usual  sterile fashion, and infiltrated locally with buffered Lidocaine. Curved 20 gauge spinal needle advanced to the superolateral margin of the right humeral head. 1 ml of Isovue-300 injected easily. Diagnostic injection of iodinated contrast demonstrates intra-articular spread without intravascular component. Aspiration was attempted with no return of fluid. At this time 8 mL of sterile saline was injected into the right glenohumeral joint and subsequently aspirated. A total volume of 6 mL of the aspirated fluid was submitted for laboratory analysis. IMPRESSION: Technically successful right shoulder aspiration under fluoroscopy. Electronically Signed   By: Elige KoHetal  Patel   On: 02/18/2016 14:44    Assessment/Plan:     Active Problems:   Septic joint of right shoulder region [facility](HCC)   Protein-calorie malnutrition, severe  Patient has had dramatic improvement in his right shoulder pain after receiving Toradol and oral steroid. I spoke with Dr. Sampson GoonFitzgerald yesterday who is going to change his IV antibiotics to continue treatment for his abdominal abscess.  Patient should complete his oral steroid course as prescribed by the hospitalist service.   Patient may follow up with me in the office if he continues to have pain after he completes his steroid course. Patient's aspirate cultures from the right shoulder remains negative to date. Patient remains afebrile.    Juanell FairlyKRASINSKI, Cloris Flippo , MD 02/20/2016, 2:17 PM

## 2016-02-20 NOTE — Progress Notes (Signed)
Report from previous shift, unable to draw labs from PICC. Order for alteplase received from MD. Spoke with charge nurse from ICU, will assist when available, this RN not certified in administration.

## 2016-02-20 NOTE — Progress Notes (Signed)
[facility] PROGRESS NOTE  Danny Klein [id number] DOB: 08/28/1960 DOA: 02/18/2016 PCP: Marina GoodellFELDPAUSCH, DALE E, MD  HPI/Subjective: Patient still can barely move his right shoulder and having right shoulder pain. No abdominal pain, nausea or vomiting or diarrhea.  Objective: Vitals:   02/20/16 0412 02/20/16 0926  BP: 121/75 121/67  Pulse: 63 76  Resp: 20   Temp: 97.5 F (36.4 C) 97.7 F (36.5 C)    Filed Weights   02/18/16 0754 02/18/16 0755 02/18/16 2114  Weight: 108.4 kg (239 lb) 113.4 kg (250 lb) 110.1 kg (242 lb 11.2 oz)    ROS: Review of Systems  Constitutional: Negative for chills and fever.  Eyes: Negative for blurred vision.  Respiratory: Negative for cough and shortness of breath.   Cardiovascular: Negative for chest pain.  Gastrointestinal: Negative for abdominal pain, constipation, diarrhea, nausea and vomiting.  Genitourinary: Negative for dysuria.  Musculoskeletal: Positive for joint pain.  Neurological: Negative for dizziness and headaches.   Exam: Physical Exam  Constitutional: He is oriented to person, place, and time.  HENT:  Nose: No mucosal edema.  Mouth/Throat: No oropharyngeal exudate or posterior oropharyngeal edema.  Eyes: Conjunctivae, EOM and lids are normal. Pupils are equal, round, and reactive to light.  Neck: No JVD present. Carotid bruit is not present. No edema present. No thyroid mass and no thyromegaly present.  Cardiovascular: S1 normal and S2 normal.  Exam reveals no gallop.   No murmur heard. Pulses:      Dorsalis pedis pulses are 2+ on the right side, and 2+ on the left side.  Respiratory: No respiratory distress. He has no wheezes. He has no rhonchi. He has no rales.  GI: Soft. Bowel sounds are normal. There is no tenderness.  Musculoskeletal:       Right shoulder: He exhibits decreased range of motion, tenderness and swelling.       Right ankle: He exhibits no swelling.       Left ankle: He exhibits no swelling.   Lymphadenopathy:    He has no cervical adenopathy.  Neurological: He is alert and oriented to person, place, and time. No cranial nerve deficit.  Skin: Skin is warm. No rash noted. Nails show no clubbing.  Psychiatric: He has a normal mood and affect.      Data Reviewed: Basic Metabolic Panel:  Recent Labs Lab 02/18/16 1141 02/19/16 0717  NA 139 137  K 4.0 3.9  CL 107 103  CO2 23 26  GLUCOSE 111* 100*  BUN 19 24*  CREATININE 0.72 0.83  CALCIUM 9.1 9.1   Liver Function Tests:  Recent Labs Lab 02/18/16 1141  AST 34  ALT 39  ALKPHOS 78  BILITOT 0.6  PROT 7.7  ALBUMIN 3.2*   CBC:  Recent Labs Lab 02/18/16 1141 02/19/16 0717  WBC 11.6* 8.2  NEUTROABS 10.0*  --   HGB 11.3* 11.3*  HCT 33.2* 32.7*  MCV 83.6 83.0  PLT 376 335     Recent Results (from the past 240 hour(s))  Body fluid culture     Status: None (Preliminary result)   Collection Time: 02/18/16  2:37 PM  Result Value Ref Range Status   Specimen Description SYNOVIAL RIGHT SHOULDER  Final   Special Requests NONE  Final   Gram Stain   Final    RARE WBC PRESENT,BOTH PMN AND MONONUCLEAR NO ORGANISMS SEEN    Culture   Final    NO GROWTH 2 DAYS [facility]  Report Status PENDING  Incomplete  Culture, blood (Routine X 2) w Reflex to ID Panel     Status: None (Preliminary result)   Collection Time: 02/19/16  2:55 PM  Result Value Ref Range Status   Specimen Description BLOOD RIGHT ASSIST CONTROL  Final   Special Requests 7 CC AEROBIC, 10 CC ANAEROBIC  Final   Culture NO GROWTH < 24 HOURS  Final   Report Status PENDING  Incomplete  Culture, blood (Routine X 2) w Reflex to ID Panel     Status: None (Preliminary result)   Collection Time: 02/19/16  2:55 PM  Result Value Ref Range Status   Specimen Description BLOOD LEFT HAND  Final   Special Requests BOTTLES DRAWN AEROBIC AND ANAEROBIC 1CC  Final   Culture NO GROWTH < 24 HOURS  Final   Report Status PENDING  Incomplete      Scheduled Meds: . alteplase  2 mg Intracatheter Once  . docusate sodium  100 mg Oral BID  . enoxaparin (LOVENOX) injection  40 mg Subcutaneous QHS  . fluticasone  1 spray Each Nare Daily  . lidocaine-EPINEPHrine  10 mL Other Once  . pantoprazole  40 mg Oral QAC breakfast  . piperacillin-tazobactam (ZOSYN)  IV  3.375 g Intravenous Q8H  . [START ON 02/21/2016] predniSONE  40 mg Oral Q breakfast   And  . [START ON 02/21/2016] predniSONE  50 mg Oral Q breakfast   And  . [START ON 02/21/2016] predniSONE  30 mg Oral Q breakfast   And  . [START ON 02/22/2016] predniSONE  20 mg Oral Q breakfast   And  . [START ON 02/23/2016] predniSONE  10 mg Oral Q breakfast  . rosuvastatin  20 mg Oral Daily  . vitamin B-12  1,000 mcg Oral Daily    Assessment/Plan:  1. Acute right shoulder pain with limited movement. Could be an before meals joint inflammation versus bursitis. Better with Toradol and prednisone. Spoke with Dr. Martha ClanKrasinski and he does not want to inject while on IV antibiotics 2. Abdominal abscess. Antibiotics switched to Zosyn IV. Care manager to set up home health at home potentially for tomorrow. 3. PICC line obstruction alteplase 4. Hyperlipidemia unspecified on Crestor  Code Status:     Code Status Orders        Start     Ordered   02/18/16 2204  Full code  Continuous     02/18/16 2203    Code Status History    Date Active Date Inactive Code Status Order ID Comments User Context   02/09/2016  2:46 PM 02/14/2016  4:40 PM Full Code [id number]  Lattie Hawichard E Cooper, MD ED   02/03/2016  4:02 PM 02/05/2016  7:24 PM Full Code [id number]  Gladis Riffleatherine L Loflin, MD Inpatient     Family Communication: Wife at the bedside Disposition Plan: Potentially home tomorrow versus Monday depending on home health set up  Consultants:  Gen. Surgery  Orthopedic surgery  Infectious disease  Antibiotics:  Zosyn  Time spent: 30 minutes  Alford HighlandWIETING, Domonique Cothran  [employer]

## 2016-02-21 ENCOUNTER — Encounter: Payer: Self-pay | Admitting: Emergency Medicine

## 2016-02-21 ENCOUNTER — Emergency Department
Admission: EM | Admit: 2016-02-21 | Discharge: 2016-02-21 | Disposition: A | Discharge status: Home / Self Care | Payer: BC Managed Care – PPO | Attending: Emergency Medicine | Admitting: Emergency Medicine

## 2016-02-21 DIAGNOSIS — Y828 Other medical devices associated with adverse incidents: Secondary | ICD-10-CM | POA: Insufficient documentation

## 2016-02-21 DIAGNOSIS — T82868A Thrombosis of vascular prosthetic devices, implants and grafts, initial encounter: Secondary | ICD-10-CM | POA: Diagnosis not present

## 2016-02-21 DIAGNOSIS — I10 Essential (primary) hypertension: Secondary | ICD-10-CM | POA: Insufficient documentation

## 2016-02-21 DIAGNOSIS — T82898A Other specified complication of vascular prosthetic devices, implants and grafts, initial encounter: Secondary | ICD-10-CM

## 2016-02-21 MED ORDER — PREDNISONE 10 MG PO TABS: 10.0000 mg | ORAL_TABLET | Freq: Every day | ORAL | Status: DC

## 2016-02-21 MED ORDER — PREDNISONE 20 MG PO TABS: 20.0000 mg | ORAL_TABLET | Freq: Every day | ORAL | Status: DC

## 2016-02-21 MED ORDER — PREDNISONE 20 MG PO TABS: 30.0000 mg | ORAL_TABLET | Freq: Every day | ORAL | Status: DC

## 2016-02-21 MED ORDER — HEPARIN SOD (PORK) LOCK FLUSH 100 UNIT/ML IV SOLN
INTRAVENOUS | Status: AC
  Filled 2016-02-21: qty 10

## 2016-02-21 MED ORDER — PREDNISONE 5 MG PO TABS: ORAL_TABLET | ORAL | 0 refills | Status: DC

## 2016-02-21 MED ORDER — SACCHAROMYCES BOULARDII 250 MG PO CAPS: 250.0000 mg | ORAL_CAPSULE | Freq: Two times a day (BID) | ORAL | 0 refills | Status: DC

## 2016-02-21 MED ORDER — SACCHAROMYCES BOULARDII 250 MG PO CAPS
250.0000 mg | ORAL_CAPSULE | Freq: Two times a day (BID) | ORAL | Status: DC
Start: 2016-02-21 — End: 2016-02-21
  Filled 2016-02-21: qty 1

## 2016-02-21 NOTE — Care Management Note (Signed)
Case Management Note  Patient Details  Name: Corky DownsSamuel V Tafolla MRN: [id number] Date of Birth: 02/02/1961  Subjective/Objective:     Spoke with Almira CoasterGina at the [facility] this morning to update her that Mr Darcey's next home IV dose of Zosyn is due at 2pm today. Almira CoasterGina stated that she would have the IV Zosyn delivered to his home before 2pm today and that an [facility] nurse would be at his home at 2pm today to assist with getting [name] on an IV administration routine of TID. Dr Renae GlossWieting was updated and will discharge [name] today with PICC line intact.                Action/Plan:   Expected Discharge Date:                  Expected Discharge Plan:     In-House Referral:     Discharge planning Services  CM Consult  Post Acute Care Choice:    Choice offered to:  Patient  DME Arranged:   [name](IVABX) DME Agency:  [facility]  [facility]  Status of Service:  In process, will continue to follow  If discussed at Long Length of Stay Meetings, dates discussed:    Additional Comments:  Gaylon Melchor A, RN 02/21/2016, 9:01 AM

## 2016-02-21 NOTE — ED Provider Notes (Signed)
[facility] Emergency Department Provider Note   ____________________________________________   First MD Initiated Contact with Patient 02/21/16 1628     (approximate)  I have reviewed the triage vital signs and the nursing notes.   HISTORY  Chief Complaint picc line clotted    HPI Danny Klein is a 55 y.o. male transfer evaluation of a clotted PICC line.  Patient reports he is discharged today, not had any trouble and home health went to set up his infusion but was unable to get it to go through the new PICC line. He's had a line for about one week and it was operating normally at 9:30 this morning.  No chest pain or shortness of breath. No fevers or chills. No redness or pain in the left arm. No numbness or tingling in the arm. Denies any complications just can't get it to flush for his Zosyn infusion at home.   Past Medical History:  Diagnosis Date  . Hypertension     Patient Active Problem List   Diagnosis Date Noted  . Protein-calorie malnutrition, severe 02/20/2016  . Septic joint of right shoulder region (HCC) 02/18/2016  . Intra-abdominal infection   . Right upper quadrant pain   . Abdominal abscess (HCC) 02/03/2016  . Essential hypertension 01/26/2016  . Hyperlipidemia 01/26/2016  . GERD (gastroesophageal reflux disease) 01/26/2016  . Anxiety 01/26/2016  . Obstructive sleep apnea syndrome 07/31/2014  . Hypogonadism in male 12/17/2013  . Shoulder bursitis 11/04/2013  . Neck pain 11/04/2013  . Disorder of bursae and tendons in shoulder region 11/04/2013  . Pain in joint involving lower leg 03/07/2012  . Thoracic or lumbosacral neuritis or radiculitis 11/04/2011  . 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 Acquired spondylolisthesis 11/04/2011    Past Surgical History:  Procedure Laterality Date  . CARDIAC CATHETERIZATION  2009  . CHOLECYSTECTOMY    . SPINAL FUSION W/ LUQUE UNIT ROD  2013     Prior to Admission medications   Medication Sig Start Date End Date Taking? Authorizing Provider  Cyanocobalamin (RA VITAMIN B-12 TR) 1000 MCG TBCR Take 1 tablet by mouth daily.     Historical Provider, MD  fluticasone (FLONASE) 50 MCG/ACT nasal spray Place 1 spray into both nostrils daily.    Historical Provider, MD  omeprazole (PRILOSEC OTC) 20 MG tablet Take 20 mg by mouth. 09/07/06   Historical Provider, MD  oxyCODONE (OXY IR/ROXICODONE) 5 MG immediate release tablet Take 1-2 tablets (5-10 mg total) by mouth every 4 (four) hours as needed for moderate pain. 02/14/16   Lattie Haw, MD  piperacillin-tazobactam (ZOSYN) 3.375 GM/50ML IVPB Inject 50 mLs (3.375 g total) into the vein every 8 (eight) hours. 02/20/16   Alford Highland, MD  predniSONE (DELTASONE) 5 MG tablet 6 tabs po day1; 5 tabs po day2; 4 tabs po day3; 3 tabs po day4; 2 tabs po day4; 1 tab po day5,6 02/21/16   Alford Highland, MD  rosuvastatin (CRESTOR) 20 MG tablet Take 20 mg by mouth daily.  12/10/15   Historical Provider, MD  saccharomyces boulardii (FLORASTOR) 250 MG capsule Take 1 capsule (250 mg total) by mouth 2 (two) times daily. 02/21/16   Alford Highland, MD    Allergies Levofloxacin; Hydrocodone; and Meperidine  Family History  Problem Relation Age of Onset  . Cancer Mother   . Heart disease Mother   . Heart disease Father     Social History Social History  Substance Use Topics  .  Smoking status: Never Smoker  . Smokeless tobacco: Never Used  . Alcohol use 0.6 oz/week    1 Cans of beer per week     Comment: drinks occassionally; 1 can beer per week    Review of Systems Constitutional: No fever/chills Eyes: No visual changes. ENT: No sore throat. Cardiovascular: Denies chest pain. Respiratory: Denies shortness of breath. Skin: Negative for rash. Neurological: Negative for headaches, focal weakness or numbness.    ____________________________________________   PHYSICAL EXAM:  VITAL SIGNS: ED  Triage Vitals  Enc Vitals Group     BP 02/21/16 1603 107/64     Pulse Rate 02/21/16 1603 68     Resp 02/21/16 1603 18     Temp 02/21/16 1603 98.2 F (36.8 C)     Temp src --      SpO2 02/21/16 1603 96 %     Weight 02/21/16 1548 242 lb (109.8 kg)     Height 02/21/16 1548 5\' 11"  (1.803 m)     Head Circumference --      Peak Flow --      Pain Score 02/21/16 1550 0     Pain Loc --      Pain Edu? --      Excl. in GC? --     Constitutional: Alert and oriented. Well appearing and in no acute distress. Eyes: Conjunctivae are normal. PERRL. EOMI. Head: Atraumatic. Nose: No congestion/rhinnorhea. Mouth/Throat: Mucous membranes are moist.   Cardiovascular: Normal rate, regular rhythm. Grossly normal heart sounds.  Good peripheral circulation. Respiratory: Normal respiratory effort. Clear. Normal respirations. Gastrointestinal: Soft and nontender. No distention. No abdominal bruits. No CVA tenderness. Musculoskeletal: No lower extremity tenderness nor edema. PICC line seated in the left forearm, clean dry and intact. No evidence of erythema, no evidence of disruption or trauma to the line. Strong radial pulse left hand. Normal use left hand. Neurologic:  Normal speech and language. No gross focal neurologic deficits are appreciated. Skin:  Skin is warm, dry and intact. Psychiatric: Mood and affect are normal. Speech and behavior are normal.  ____________________________________________   LABS (all labs ordered are listed, but only abnormal results are displayed)  Labs Reviewed - No data to display ____________________________________________  EKG   ____________________________________________  RADIOLOGY   ____________________________________________   PROCEDURES  Procedure(s) performed: None  Procedures  Critical Care performed: No  ____________________________________________   INITIAL IMPRESSION / ASSESSMENT AND PLAN / ED COURSE  Pertinent labs & imaging results  that were available during my care of the patient were reviewed by me and considered in my medical decision making (see chart for details).  Patient notes for evaluation of his PICC line. Charge nurse was able to flush it without requiring any heparin or tPA. Functioning normally after flush. They does not complain of any cardiac or pulmonary symptoms. Appears line is operating at this time.  Discussed with the patient, discharged to home and he will continue his Zosyn infusions and follow-up with home health and primary care doctor as scheduled.  Return precautions and treatment recommendations and follow-up discussed with the patient who is agreeable with the plan.   Clinical Course     ____________________________________________   FINAL CLINICAL IMPRESSION(S) / ED DIAGNOSES  Final diagnoses:  Occluded PICC line, initial encounter (HCC)      NEW MEDICATIONS STARTED DURING THIS VISIT:  New Prescriptions   No medications on file     Note:  This document was prepared using Dragon voice recognition software and may include  unintentional dictation errors.     Sharyn CreamerMark Nica Friske, MD 02/21/16 [phone]

## 2016-02-21 NOTE — Discharge Instructions (Signed)

## 2016-02-21 NOTE — ED Triage Notes (Signed)
Patient discharged from hospital this morning with PICC line to left arm for home antibiotic therapy.  Home health to patient's home today to give Zosyn, and PICC line clotted.

## 2016-02-21 NOTE — ED Notes (Signed)
Patient presents with LUA Double lumen picc. Patient denies pain, complication, or trauma to site. Site is C/D/I.   10CC 0.9% NS infused into each lumen.  PICC Flushes freely

## 2016-02-21 NOTE — Progress Notes (Signed)
[facility] to deliver next dose of abx at 2pm.

## 2016-02-21 NOTE — Care Management Note (Signed)
Case Management Note  Patient Details  Name: Danny Klein MRN: [id number] Date of Birth: 05/22/1961  Subjective/Objective:   Referral faxed to [facility] requesting home health RN services. RN is to be at [name]'s home today to facilitate the transition from once a day IV administration to TID administration.                 Action/Plan:   Expected Discharge Date:                  Expected Discharge Plan:     In-House Referral:     Discharge planning Services  CM Consult  Post Acute Care Choice:    Choice offered to:  Patient  DME Arranged:   [name](IVABX) DME Agency:  [facility]  [facility]  Status of Service:  In process, will continue to follow  If discussed at Long Length of Stay Meetings, dates discussed:    Additional Comments:  Cathi Hazan A, RN 02/21/2016, 10:23 AM

## 2016-02-21 NOTE — Discharge Summary (Signed)
[employer] - [place] at [facility]   PATIENT NAME: Danny RichardsSamuel Klein    MR#:  [id number]  DATE OF BIRTH:  05/17/1961  DATE OF ADMISSION:  02/18/2016 ADMITTING PHYSICIAN: Danny LabAruna Gouru, MD  DATE OF DISCHARGE: 02/21/2016 11:08 AM  PRIMARY CARE PHYSICIAN: Klein, Danny E, MD    ADMISSION DIAGNOSIS:  Joint pain [M25.50] Pain [R52] Shoulder pain, right [M25.511] Intractable pain [R52] Septic joint (HCC) [M00.9]  DISCHARGE DIAGNOSIS:  Active Problems:   Septic joint of right shoulder region (HCC)   Protein-calorie malnutrition, severe   SECONDARY DIAGNOSIS:   Past Medical History:  Diagnosis Date  . Hypertension     HOSPITAL COURSE:   1.Acute right shoulder pain with limited movement. This is ac joint inflammation versus bursitis. Patient much better with Toradol and prednisone. Follow-up with Dr. Martha Klein as outpatient. Likely will need outpatient physical therapy. Patient now able to move his arm much better than the day before. Severe pain is improved. 2. Abdominal abscess. Antibiotics switched to IV Zosyn as per infectious disease. Set up home health again. 2 week prescription of Zosyn written. As per infectious disease May need 2-4 weeks of IV antibiotics. Weekly labs with results to go to Dr. Sampson Klein. Must see Dr. Sampson Klein before his antibiotics are up. 3. Hyperlipidemia unspecified on Crestor. 4. GERD on omeprazole  DISCHARGE CONDITIONS:   Satisfactory  CONSULTS OBTAINED:  Treatment Team:  Danny Frameharles Woodham, MD Mick Sellavid P Fitzgerald, MD  DRUG ALLERGIES:   Allergies  Allergen Reactions  . Levofloxacin     Tendinitis/Severe Tendinopathy    . Hydrocodone Itching and Other (See Comments)    Made him feel violent  . Meperidine Nausea And Vomiting and Other (See Comments)    Nausea and severe vomiting , talking out of head     DISCHARGE MEDICATIONS:   Discharge Medication List as of 02/21/2016  9:43 AM    START taking these medications   Details  piperacillin-tazobactam (ZOSYN) 3.375 GM/50ML IVPB Inject 50 mLs (3.375 g total) into the vein every 8 (eight) hours., Starting Sat 02/20/2016, Print      CONTINUE these medications which have CHANGED   Details  predniSONE (DELTASONE) 5 MG tablet 6 tabs po day1; 5 tabs po day2; 4 tabs po day3; 3 tabs po day4; 2 tabs po day4; 1 tab po day5,6, Print    saccharomyces boulardii (FLORASTOR) 250 MG capsule Take 1 capsule (250 mg total) by mouth 2 (two) times daily., Starting Sun 02/21/2016, Print      CONTINUE these medications which have NOT CHANGED   Details  Cyanocobalamin (RA VITAMIN B-12 TR) 1000 MCG TBCR Take 1 tablet by mouth daily. , Historical Med    fluticasone (FLONASE) 50 MCG/ACT nasal spray Place 1 spray into both nostrils daily., Historical Med    omeprazole (PRILOSEC OTC) 20 MG tablet Take 20 mg by mouth., Starting Thu 09/07/2006, Historical Med    oxyCODONE (OXY IR/ROXICODONE) 5 MG immediate release tablet Take 1-2 tablets (5-10 mg total) by mouth every 4 (four) hours as needed for moderate pain., Starting Sun 02/14/2016, Print    rosuvastatin (CRESTOR) 20 MG tablet Take 20 mg by mouth daily. , Starting Thu 12/10/2015, Historical Med      STOP taking these medications     levofloxacin (LEVAQUIN) 750 MG/150ML SOLN          DISCHARGE INSTRUCTIONS:   Follow-up PMD in 1 week Follow-up Dr. Martha Klein 2 weeks Follow-up with Dr. Sampson Klein prior to antibiotics finishing.  If you experience  worsening of your admission symptoms, develop shortness of breath, life threatening emergency, suicidal or homicidal thoughts you must seek medical attention immediately by calling 911 or calling your MD immediately  if symptoms less severe.  You Must read complete instructions/literature along with all the possible adverse reactions/side effects for all the Medicines you take and that have been prescribed to you. Take any new Medicines after you have completely understood and accept all  the possible adverse reactions/side effects.   Please note  You were cared for by a hospitalist during your hospital stay. If you have any questions about your discharge medications or the care you received while you were in the hospital after you are discharged, you can call the unit and asked to speak with the hospitalist on call if the hospitalist that took care of you is not available. Once you are discharged, your primary care physician will handle any further medical issues. Please note that NO REFILLS for any discharge medications will be authorized once you are discharged, as it is imperative that you return to your primary care physician (or establish a relationship with a primary care physician if you do not have one) for your aftercare needs so that they can reassess your need for medications and monitor your lab values.    Today   CHIEF COMPLAINT:   Chief Complaint  Patient presents with  . Neck Pain    HISTORY OF PRESENT ILLNESS:  Danny Klein  is a 55 y.o. male presented with right shoulder pain   VITAL SIGNS:  Blood pressure (!) 113/58, pulse 62, temperature 97.9 F (36.6 C), temperature source Oral, resp. rate 18, height 5\' 6"  (1.676 m), weight 110.1 kg (242 lb 11.2 oz), SpO2 100 %.    PHYSICAL EXAMINATION:  GENERAL:  55 y.o.-year-old patient lying in the bed with no acute distress.  EYES: Pupils equal, round, reactive to light and accommodation. No scleral icterus. Extraocular muscles intact.  HEENT: Head atraumatic, normocephalic. Oropharynx and nasopharynx clear.  NECK:  Supple, no jugular venous distention. No thyroid enlargement, no tenderness.  LUNGS: Normal breath sounds bilaterally, no wheezing, rales,rhonchi or crepitation. No use of accessory muscles of respiration.  CARDIOVASCULAR: S1, S2 normal. No murmurs, rubs, or gallops.  ABDOMEN: Soft, non-tender, non-distended. Bowel sounds present. No organomegaly or mass.  EXTREMITIES: No pedal edema,  cyanosis, or clubbing. Better range of motion right shoulder. Able to lift it up to 90 laterally. Not having pain when I palpate on the top. NEUROLOGIC: Cranial nerves II through XII are intact. Muscle strength 5/5 in all extremities. Sensation intact. Gait not checked.  PSYCHIATRIC: The patient is alert and oriented x 3.  SKIN: No obvious rash, lesion, or ulcer.   DATA REVIEW:   CBC  Recent Labs Lab 02/19/16 0717  WBC 8.2  HGB 11.3*  HCT 32.7*  PLT 335    Chemistries   Recent Labs Lab 02/18/16 1141 02/19/16 0717  NA 139 137  K 4.0 3.9  CL 107 103  CO2 23 26  GLUCOSE 111* 100*  BUN 19 24*  CREATININE 0.72 0.83  CALCIUM 9.1 9.1  AST 34  --   ALT 39  --   ALKPHOS 78  --   BILITOT 0.6  --      Microbiology Results  Results for orders placed or performed during the hospital encounter of 02/18/16  Body fluid culture     Status: None (Preliminary result)   Collection Time: 02/18/16  2:37 PM  Result Value  Ref Range Status   Specimen Description SYNOVIAL RIGHT SHOULDER  Final   Special Requests NONE  Final   Gram Stain   Final    RARE WBC PRESENT,BOTH PMN AND MONONUCLEAR NO ORGANISMS SEEN    Culture   Final    NO GROWTH 3 DAYS Performed at [facility]    Report Status PENDING  Incomplete  Culture, blood (Routine X 2) w Reflex to ID Panel     Status: None (Preliminary result)   Collection Time: 02/19/16  2:55 PM  Result Value Ref Range Status   Specimen Description BLOOD RIGHT ASSIST CONTROL  Final   Special Requests 7 CC AEROBIC, 10 CC ANAEROBIC  Final   Culture NO GROWTH 2 DAYS  Final   Report Status PENDING  Incomplete  Culture, blood (Routine X 2) w Reflex to ID Panel     Status: None (Preliminary result)   Collection Time: 02/19/16  2:55 PM  Result Value Ref Range Status   Specimen Description BLOOD LEFT HAND  Final   Special Requests BOTTLES DRAWN AEROBIC AND ANAEROBIC 1CC  Final   Culture NO GROWTH 2 DAYS  Final   Report Status PENDING   Incomplete     Management plans discussed with the patient, family and they are in agreement.  CODE STATUS:  Code Status History    Date Active Date Inactive Code Status Order ID Comments User Context   02/18/2016 10:03 PM 02/21/2016  2:13 PM Full Code [id number]  Danny Lab, MD Inpatient   02/09/2016  2:46 PM 02/14/2016  4:40 PM Full Code [id number]  Lattie Haw, MD ED   02/03/2016  4:02 PM 02/05/2016  7:24 PM Full Code [id number]  Gladis Riffle, MD Inpatient      TOTAL TIME TAKING CARE OF THIS PATIENT: 35 minutes.    Alford Highland M.D on 02/21/2016 at 3:42 PM  Between 7am to 6pm - Pager - [phone]  After 6pm go to [email or web address] - password [email or web address]  [employer] [facility] Office  [phone]  CC: Primary care physician; [facility], Madaline Guthrie, MD

## 2016-02-21 NOTE — Progress Notes (Signed)
[employer] - [place] at [facility]        Norvel RichardsSamuel Luellen was admitted to the Hospital on 02/18/2016 and Discharged  02/21/2016 and should be excused from work/school   for 18  days starting 02/18/2016 , may return to work/school without any restrictions.  Alford HighlandWIETING, Anastazja Isaac M.D on 02/21/2016,at 9:16 AM  [employer] - [place] [facility]    Office  [phone]

## 2016-02-22 LAB — BODY FLUID CULTURE: CULTURE: NO GROWTH

## 2016-02-24 LAB — CULTURE, BLOOD (ROUTINE X 2)
Culture: NO GROWTH
Culture: NO GROWTH
Special Requests: 7

## 2016-03-02 ENCOUNTER — Telehealth: Payer: Self-pay

## 2016-03-02 DIAGNOSIS — R1031 Right lower quadrant pain: Secondary | ICD-10-CM

## 2016-03-02 NOTE — Telephone Encounter (Signed)
Spoke with Dr. Orvis BrillLoflin. She has ordered a STAT CT scan of Abdomen and Pelvis to be done tomorrow and the patient to follow-up in office afterwards.  Patient in agreement.  CT Scan scheduled for Tomorrow, 03/03/16 at 0930am- arrive at 0730am for Contrast. NPO after MN.  Patient will be seen at 1530 tomorrow by Dr. Orvis BrillLoflin

## 2016-03-02 NOTE — Telephone Encounter (Signed)
Abdominal Pain- Right sided 3/10 x 6 hours. IV Zosyn- 3.375g TID through PICC line. No drain. Denies fever or chills. Denies any Nausea, vomiting, constipation, or diarrhea. Bowels have been moving normally. Labs drawn yesterday through [facility] and were taken to [facility].  Last CT Scan 02/09/16.  F/U with Dr. Sampson GoonFitzgerald on 9/1.  I explained to patient that I would speak with Dr. Orvis BrillLoflin in regards to his symptoms and call him back as soon as I have a plan. Patient was satsified with this and will await my call for instructions.

## 2016-03-03 ENCOUNTER — Ambulatory Visit
Admission: RE | Admit: 2016-03-03 | Discharge: 2016-03-03 | Disposition: A | Discharge status: Home / Self Care | Payer: BC Managed Care – PPO | Source: Ambulatory Visit | Attending: Surgery | Admitting: Surgery

## 2016-03-03 ENCOUNTER — Ambulatory Visit (INDEPENDENT_AMBULATORY_CARE_PROVIDER_SITE_OTHER): Payer: BC Managed Care – PPO | Admitting: Surgery

## 2016-03-03 ENCOUNTER — Encounter
Admission: RE | Admit: 2016-03-03 | Discharge: 2016-03-03 | Disposition: A | Discharge status: Home / Self Care | Payer: BC Managed Care – PPO | Source: Ambulatory Visit | Attending: Surgery | Admitting: Surgery

## 2016-03-03 ENCOUNTER — Encounter: Payer: Self-pay | Admitting: Surgery

## 2016-03-03 ENCOUNTER — Other Ambulatory Visit: Payer: Self-pay

## 2016-03-03 VITALS — BP 113/76 | HR 103 | Temp 98.6°F | Ht 71.0 in | Wt 241.4 lb

## 2016-03-03 DIAGNOSIS — R932 Abnormal findings on diagnostic imaging of liver and biliary tract: Secondary | ICD-10-CM | POA: Insufficient documentation

## 2016-03-03 DIAGNOSIS — R1031 Right lower quadrant pain: Secondary | ICD-10-CM | POA: Diagnosis present

## 2016-03-03 DIAGNOSIS — I10 Essential (primary) hypertension: Secondary | ICD-10-CM | POA: Diagnosis not present

## 2016-03-03 DIAGNOSIS — K651 Peritoneal abscess: Secondary | ICD-10-CM

## 2016-03-03 DIAGNOSIS — R1011 Right upper quadrant pain: Secondary | ICD-10-CM | POA: Diagnosis not present

## 2016-03-03 DIAGNOSIS — IMO0002 Reserved for concepts with insufficient information to code with codable children: Secondary | ICD-10-CM

## 2016-03-03 HISTORY — DX: Cutaneous abscess of abdominal wall: L02.211

## 2016-03-03 HISTORY — DX: Sleep apnea, unspecified: G47.30

## 2016-03-03 HISTORY — DX: Hyperlipidemia, unspecified: E78.5

## 2016-03-03 HISTORY — DX: Chronic kidney disease, unspecified: N18.9

## 2016-03-03 HISTORY — DX: Gastro-esophageal reflux disease without esophagitis: K21.9

## 2016-03-03 HISTORY — DX: Unspecified osteoarthritis, unspecified site: M19.90

## 2016-03-03 MED ORDER — IOPAMIDOL (ISOVUE-300) INJECTION 61%
100.0000 mL | Freq: Once | INTRAVENOUS | Status: AC | PRN
  Administered 2016-03-03: 100 mL via INTRAVENOUS

## 2016-03-03 NOTE — Patient Instructions (Addendum)
  Your procedure is scheduled on:March 04, 2016 (Friday) Report to Same Day Surgery 2nd floor Medical Mall To find out your arrival time please call [phone] between 1PM - 3PM on ARRIVAL TIME 7:30 AM  Remember: Instructions that are not followed completely may result in serious medical risk, up to and including death, or upon the discretion of your surgeon and anesthesiologist your surgery may need to be rescheduled.    _x___ 1. Do not eat food or drink liquids after midnight. No gum chewing or hard candies.     _x_ 2. No Alcohol for 24 hours before or after surgery.   _x__3. No Smoking for 24 prior to surgery.   ____  4. Bring all medications with you on the day of surgery if instructed.    __x__ 5. Notify your doctor if there is any change in your medical condition     (cold, fever, infections).     Do not wear jewelry, make-up, hairpins, clips or nail polish.  Do not wear lotions, powders, or perfumes. You may wear deodorant.  Do not shave 48 hours prior to surgery. Men may shave face and neck.  Do not bring valuables to the hospital.    [facility] any belongings or valuables.               Contacts, dentures or bridgework may not be worn into surgery.  Leave your suitcase in the car. After surgery it may be brought to your room.  For patients admitted to the hospital, discharge time is determined by your treatment team.   Patients discharged the day of surgery will not be allowed to drive home.    Please read over the following fact sheets that you were given:   [facility] Preparing for Surgery and or MRSA Information   _x___ Take these medicines the morning of surgery with A SIP OF WATER:    1. Protonix (Protonix at bedtime tonight)  2.  3.  4.  5.  6.  ____ Fleet Enema (as directed)   _x___ Use CHG Soap or sage wipes as directed on instruction sheet   ____ Use inhalers on the day of surgery and bring to hospital day of  surgery  ____ Stop metformin 2 days prior to surgery    ____ Take 1/2 of usual insulin dose the night before surgery and none on the morning of  surgery.            ____ Stop aspirin or coumadin, or plavix  _x__ Stop Anti-inflammatories such as Advil, Aleve, Ibuprofen, Motrin, Naproxen,          Naprosyn, Goodies powders or aspirin products. Ok to take Tylenol.   _x___ Stop supplements until after surgery.  (STOP VITAMIN B-12 NOW)  ____ Bring C-Pap to the hospital.

## 2016-03-03 NOTE — H&P (Signed)
Danny DownsSamuel V Pennick is an 55 y.o. male.   Chief Complaint: abdominal pain HPI: [age]yr old male with abdominal pain in the RUQ/lateral abdomen.  Patient has been dealing with this pain for about 6 months after having a Lap chole.  He has been admitted multiple times for this pain and was recently admitted and been on IV Zosyn at home.  Yesterday he started to have the pain in the same area again, along with some increased fatigue and malaise. Patient states some decrease in appetite as well but no vomiting.  He denies any fever as well.  He has been getting his Zosyn at home and home health has been visiting.  The patient is very frustrated and at time even tearful.   Past Medical History:  Diagnosis Date  . Hypertension     Past Surgical History:  Procedure Laterality Date  . CARDIAC CATHETERIZATION  2009  . CHOLECYSTECTOMY    . SPINAL FUSION W/ LUQUE UNIT ROD  2013    Family History  Problem Relation Age of Onset  . Cancer Mother   . Heart disease Mother   . Heart disease Father    Social History:  reports that he has never smoked. He has never used smokeless tobacco. He reports that he drinks about 0.6 oz of alcohol per week . He reports that he does not use drugs.  Allergies:  Allergies  Allergen Reactions  . Levofloxacin     Tendinitis/Severe Tendinopathy    . Hydrocodone Itching and Other (See Comments)    Made him feel violent  . Meperidine Nausea And Vomiting and Other (See Comments)    Nausea and severe vomiting , talking out of head      (Not in a hospital admission)  No results found for this or any previous visit (from the past 48 hour(s)). Ct Abdomen Pelvis W Contrast  Result Date: 03/03/2016 CLINICAL DATA:  Follow-up evaluation after drainage 02/04/2016. Continued pain. EXAM: CT ABDOMEN AND PELVIS WITH CONTRAST TECHNIQUE: Multidetector CT imaging of the abdomen and pelvis was performed using the standard protocol following bolus administration of intravenous  contrast. CONTRAST:  100mL ISOVUE-300 IOPAMIDOL (ISOVUE-300) INJECTION 61% COMPARISON:  02/09/2016, 02/18/2016 FINDINGS: Lower chest: Normal heart size. No pericardial effusion. Bibasilar atelectasis. Hepatobiliary: Persistent ill-defined soft tissue along the inferior margin of the right hepatic lobe with inflammatory stranding which has improved compared with the prior examination. No drainable fluid collection. Liver is otherwise normal. Prior cholecystectomy. Pancreas: Normal. Spleen: Normal. Adrenals/Urinary Tract: Normal adrenal glands. Postoperative changes along the inferior pole of the left kidney. Kidneys are otherwise normal. No urolithiasis or obstructive uropathy. Normal bladder. Stomach/Bowel: No bowel wall thickening or dilatation. Normal appendix. Diverticulosis without evidence of diverticulitis. No pneumatosis, pneumoperitoneum or portal venous gas. No abdominal or pelvic free fluid. Vascular/Lymphatic: Normal caliber abdominal aorta. No lymphadenopathy. Other: No fluid collection or hematoma. Musculoskeletal: No acute osseous abnormality. No aggressive lytic or sclerotic osseous lesion. Posterior spinal fusion at L4-5. Severe degenerative disc disease with disc height loss at L5-S1. IMPRESSION: 1. Persistent ill-defined soft tissue along the inferior margin of the right hepatic lobe with inflammatory stranding which has improved compared with the prior examination. Electronically Signed   By: Elige KoHetal  Patel   On: 03/03/2016 09:13    Review of Systems  Constitutional: Positive for malaise/fatigue and weight loss. Negative for chills, diaphoresis and fever.  HENT: Negative for congestion, ear pain and sore throat.   Respiratory: Negative for cough, hemoptysis, sputum production, shortness of breath  and wheezing.   Cardiovascular: Negative for chest pain, palpitations, orthopnea, claudication, leg swelling and PND.  Gastrointestinal: Positive for abdominal pain and nausea. Negative for blood in  stool, constipation, diarrhea, heartburn and vomiting.  Genitourinary: Positive for flank pain. Negative for dysuria, frequency, hematuria and urgency.  Musculoskeletal: Negative for back pain, joint pain and neck pain.  Skin: Negative for itching and rash.  Neurological: Negative for dizziness, loss of consciousness and weakness.  Psychiatric/Behavioral: Negative for depression. The patient is not nervous/anxious.   All other systems reviewed and are negative.   Blood pressure 113/76, pulse (!) 103, temperature 98.6 F (37 C), temperature source Oral, height 5\' 11"  (1.803 m), weight 241 lb 6.4 oz (109.5 kg). Physical Exam  Vitals reviewed. Constitutional: He is oriented to person, place, and time. He appears well-developed and well-nourished. No distress.  HENT:  Head: Normocephalic and atraumatic.  Right Ear: External ear normal.  Left Ear: External ear normal.  Nose: Nose normal.  Mouth/Throat: Oropharynx is clear and moist. No oropharyngeal exudate.  Eyes: Conjunctivae and EOM are normal. Pupils are equal, round, and reactive to light. No scleral icterus.  Neck: Normal range of motion. Neck supple. No tracheal deviation present.  Cardiovascular: Normal rate, regular rhythm, normal heart sounds and intact distal pulses.  Exam reveals no gallop and no friction rub.   No murmur heard. Respiratory: Effort normal and breath sounds normal. No respiratory distress. He has no wheezes. He has no rales.  GI: Soft. Bowel sounds are normal. He exhibits no distension and no mass. There is tenderness. There is guarding. There is no rebound.  Right upper quadrant and right lateral pain   Musculoskeletal: Normal range of motion. He exhibits no edema, tenderness or deformity.  Neurological: He is alert and oriented to person, place, and time. No cranial nerve deficit.  Skin: Skin is warm and dry. No rash noted. No erythema. No pallor.  Psychiatric: He has a normal mood and affect. His behavior is  normal. Judgment and thought content normal.     Assessment/Plan 55 yr old male with intraabdominal abscess after having lap chole back in April.  I have reviewed all the patients past admissions since that time as well as the notes from [facility].  I have reviewed his most recent laboratory values as well as his fromt he past few admissions, with a normal WBC.  I have personally reviewed his CT scan images to include today's scan as well as 5 other CT scans all which show this area of inflammation, phelgmon just lateral to the liver.    The risks, benefits, complications, treatment options, and expected outcomes were discussed with the patient. The possibilities of bleeding, recurrent infection, inability to diagnose the problem, perforation of viscus organs, damage to surrounding structures, abscess formation, needing a drain placed, the need for additional procedures, reaction to medication, pulmonary aspiration,  failure to diagnose a condition, the possible need to convert to an open procedure, and creating a complication requiring transfusion or operation were discussed with the patient. The patient and concurred with the proposed plan, giving informed consent. I will schedule him for Diagnostic Laparoscopy possible laparotomy tomorrow.    Gladis Riffle, MD 03/03/2016, 4:17 PM

## 2016-03-03 NOTE — Patient Instructions (Signed)
We will do your surgery tomorrow. Be at hospital at 0745am in the morning.  We will walk you down to pre-admission testing now to meet with Anesthesia.

## 2016-03-03 NOTE — Progress Notes (Signed)
Please see H&P

## 2016-03-04 ENCOUNTER — Observation Stay
Admission: RE | Admit: 2016-03-04 | Discharge: 2016-03-06 | Disposition: A | Discharge status: Home Health | Payer: BC Managed Care – PPO | Source: Ambulatory Visit | Attending: Surgery | Admitting: Surgery

## 2016-03-04 ENCOUNTER — Encounter
Admission: RE | Disposition: A | Discharge status: Home / Self Care | Payer: Self-pay | Source: Ambulatory Visit | Attending: Surgery

## 2016-03-04 ENCOUNTER — Telehealth: Payer: Self-pay | Admitting: Surgery

## 2016-03-04 ENCOUNTER — Ambulatory Visit: Payer: BC Managed Care – PPO | Admitting: Anesthesiology

## 2016-03-04 ENCOUNTER — Encounter: Payer: Self-pay | Admitting: *Deleted

## 2016-03-04 DIAGNOSIS — K6811 Postprocedural retroperitoneal abscess: Secondary | ICD-10-CM | POA: Diagnosis not present

## 2016-03-04 DIAGNOSIS — I10 Essential (primary) hypertension: Secondary | ICD-10-CM | POA: Diagnosis not present

## 2016-03-04 DIAGNOSIS — G8929 Other chronic pain: Secondary | ICD-10-CM | POA: Diagnosis not present

## 2016-03-04 DIAGNOSIS — K651 Peritoneal abscess: Secondary | ICD-10-CM | POA: Diagnosis present

## 2016-03-04 DIAGNOSIS — B999 Unspecified infectious disease: Secondary | ICD-10-CM | POA: Diagnosis not present

## 2016-03-04 DIAGNOSIS — K9186 Retained cholelithiasis following cholecystectomy: Secondary | ICD-10-CM | POA: Diagnosis not present

## 2016-03-04 HISTORY — PX: LAPAROSCOPY: SHX197

## 2016-03-04 SURGERY — LAPAROSCOPY, DIAGNOSTIC
Anesthesia: General | Wound class: Clean Contaminated

## 2016-03-04 MED ORDER — FENTANYL CITRATE (PF) 100 MCG/2ML IJ SOLN
INTRAMUSCULAR | Status: DC | PRN
  Administered 2016-03-04 (×4): 50 ug via INTRAVENOUS

## 2016-03-04 MED ORDER — HYDRALAZINE HCL 20 MG/ML IJ SOLN: 10.0000 mg | INTRAMUSCULAR | Status: DC | PRN

## 2016-03-04 MED ORDER — MIDAZOLAM HCL 2 MG/2ML IJ SOLN
INTRAMUSCULAR | Status: DC | PRN
  Administered 2016-03-04: 2 mg via INTRAVENOUS

## 2016-03-04 MED ORDER — ROCURONIUM BROMIDE 100 MG/10ML IV SOLN
INTRAVENOUS | Status: DC | PRN
  Administered 2016-03-04: 40 mg via INTRAVENOUS
  Administered 2016-03-04: 30 mg via INTRAVENOUS
  Administered 2016-03-04: 10 mg via INTRAVENOUS

## 2016-03-04 MED ORDER — ROSUVASTATIN CALCIUM 20 MG PO TABS
20.0000 mg | ORAL_TABLET | Freq: Every evening | ORAL | Status: DC
  Administered 2016-03-04 – 2016-03-05 (×2): 20 mg via ORAL
  Filled 2016-03-04 (×2): qty 1

## 2016-03-04 MED ORDER — ACETAMINOPHEN 10 MG/ML IV SOLN
INTRAVENOUS | Status: AC
  Filled 2016-03-04: qty 100

## 2016-03-04 MED ORDER — OXYCODONE HCL 5 MG PO TABS: 5.0000 mg | ORAL_TABLET | Freq: Once | ORAL | Status: DC | PRN

## 2016-03-04 MED ORDER — LISINOPRIL 20 MG PO TABS
20.0000 mg | ORAL_TABLET | Freq: Every day | ORAL | Status: DC
  Administered 2016-03-04: 20 mg via ORAL
  Filled 2016-03-04 (×2): qty 1

## 2016-03-04 MED ORDER — OXYCODONE HCL 5 MG PO TABS
5.0000 mg | ORAL_TABLET | ORAL | Status: DC | PRN
  Administered 2016-03-05 – 2016-03-06 (×3): 5 mg via ORAL
  Administered 2016-03-06: 10 mg via ORAL
  Filled 2016-03-04: qty 1
  Filled 2016-03-04: qty 2
  Filled 2016-03-04 (×2): qty 1

## 2016-03-04 MED ORDER — PROMETHAZINE HCL 25 MG/ML IJ SOLN: 6.2500 mg | INTRAMUSCULAR | Status: DC | PRN

## 2016-03-04 MED ORDER — SUGAMMADEX SODIUM 500 MG/5ML IV SOLN
INTRAVENOUS | Status: DC | PRN
  Administered 2016-03-04: 225 mg via INTRAVENOUS

## 2016-03-04 MED ORDER — LACTATED RINGERS IV SOLN
INTRAVENOUS | Status: DC
  Administered 2016-03-04 – 2016-03-06 (×3): via INTRAVENOUS

## 2016-03-04 MED ORDER — ONDANSETRON HCL 4 MG/2ML IJ SOLN: 4.0000 mg | Freq: Four times a day (QID) | INTRAMUSCULAR | Status: DC | PRN

## 2016-03-04 MED ORDER — FLUTICASONE PROPIONATE 50 MCG/ACT NA SUSP
1.0000 | Freq: Every day | NASAL | Status: DC
  Filled 2016-03-04: qty 16

## 2016-03-04 MED ORDER — CHLORHEXIDINE GLUCONATE CLOTH 2 % EX PADS: 6.0000 | MEDICATED_PAD | Freq: Once | CUTANEOUS | Status: DC

## 2016-03-04 MED ORDER — DEXAMETHASONE SODIUM PHOSPHATE 10 MG/ML IJ SOLN
INTRAMUSCULAR | Status: DC | PRN
  Administered 2016-03-04: 5 mg via INTRAVENOUS

## 2016-03-04 MED ORDER — KETOROLAC TROMETHAMINE 30 MG/ML IJ SOLN
30.0000 mg | Freq: Four times a day (QID) | INTRAMUSCULAR | Status: DC
  Administered 2016-03-04 – 2016-03-06 (×8): 30 mg via INTRAVENOUS
  Filled 2016-03-04 (×8): qty 1

## 2016-03-04 MED ORDER — FENTANYL CITRATE (PF) 100 MCG/2ML IJ SOLN
INTRAMUSCULAR | Status: AC
  Administered 2016-03-04: 25 ug via INTRAVENOUS
  Filled 2016-03-04: qty 2

## 2016-03-04 MED ORDER — SACCHAROMYCES BOULARDII 250 MG PO CAPS
250.0000 mg | ORAL_CAPSULE | Freq: Two times a day (BID) | ORAL | Status: DC
  Administered 2016-03-04 – 2016-03-06 (×4): 250 mg via ORAL
  Filled 2016-03-04 (×7): qty 1

## 2016-03-04 MED ORDER — MEPERIDINE HCL 25 MG/ML IJ SOLN: 6.2500 mg | INTRAMUSCULAR | Status: DC | PRN

## 2016-03-04 MED ORDER — ACETAMINOPHEN 325 MG PO TABS
650.0000 mg | ORAL_TABLET | Freq: Four times a day (QID) | ORAL | Status: DC
  Administered 2016-03-04 – 2016-03-06 (×8): 650 mg via ORAL
  Filled 2016-03-04 (×8): qty 2

## 2016-03-04 MED ORDER — OXYCODONE HCL 5 MG/5ML PO SOLN: 5.0000 mg | Freq: Once | ORAL | Status: DC | PRN

## 2016-03-04 MED ORDER — DEXTROSE 5 % IV SOLN
2.0000 g | INTRAVENOUS | Status: AC
  Administered 2016-03-04: 2 g via INTRAVENOUS
  Filled 2016-03-04: qty 2

## 2016-03-04 MED ORDER — HYDROMORPHONE HCL 1 MG/ML IJ SOLN
0.5000 mg | INTRAMUSCULAR | Status: DC | PRN
  Administered 2016-03-04 (×2): 0.5 mg via INTRAVENOUS
  Filled 2016-03-04 (×2): qty 1

## 2016-03-04 MED ORDER — HYDROCHLOROTHIAZIDE 25 MG PO TABS
25.0000 mg | ORAL_TABLET | Freq: Every day | ORAL | Status: DC
  Administered 2016-03-04: 25 mg via ORAL
  Filled 2016-03-04 (×2): qty 1

## 2016-03-04 MED ORDER — SODIUM CHLORIDE FLUSH 0.9 % IV SOLN
INTRAVENOUS | Status: AC
  Administered 2016-03-04: 08:00:00
  Filled 2016-03-04: qty 10

## 2016-03-04 MED ORDER — SODIUM CHLORIDE 0.9 % IV SOLN: 1.0000 g | Freq: Every day | INTRAVENOUS | 14 refills | Status: DC

## 2016-03-04 MED ORDER — ENOXAPARIN SODIUM 40 MG/0.4ML ~~LOC~~ SOLN
40.0000 mg | SUBCUTANEOUS | Status: DC
  Administered 2016-03-04 – 2016-03-05 (×2): 40 mg via SUBCUTANEOUS
  Filled 2016-03-04 (×3): qty 0.4

## 2016-03-04 MED ORDER — CYCLOBENZAPRINE HCL 10 MG PO TABS
10.0000 mg | ORAL_TABLET | Freq: Three times a day (TID) | ORAL | Status: DC
  Administered 2016-03-04 – 2016-03-06 (×6): 10 mg via ORAL
  Filled 2016-03-04 (×6): qty 1

## 2016-03-04 MED ORDER — PROPOFOL 10 MG/ML IV BOLUS
INTRAVENOUS | Status: DC | PRN
  Administered 2016-03-04: 150 mg via INTRAVENOUS

## 2016-03-04 MED ORDER — FENTANYL CITRATE (PF) 100 MCG/2ML IJ SOLN
25.0000 ug | INTRAMUSCULAR | Status: DC | PRN
  Administered 2016-03-04 (×4): 25 ug via INTRAVENOUS

## 2016-03-04 MED ORDER — LIDOCAINE HCL (CARDIAC) 20 MG/ML IV SOLN
INTRAVENOUS | Status: DC | PRN
  Administered 2016-03-04: 100 mg via INTRAVENOUS

## 2016-03-04 MED ORDER — BUPIVACAINE-EPINEPHRINE (PF) 0.5% -1:200000 IJ SOLN
INTRAMUSCULAR | Status: AC
  Filled 2016-03-04: qty 30

## 2016-03-04 MED ORDER — ONDANSETRON 4 MG PO TBDP: 4.0000 mg | ORAL_TABLET | Freq: Four times a day (QID) | ORAL | Status: DC | PRN

## 2016-03-04 MED ORDER — LISINOPRIL-HYDROCHLOROTHIAZIDE 20-25 MG PO TABS: 1.0000 | ORAL_TABLET | Freq: Every day | ORAL | Status: DC

## 2016-03-04 MED ORDER — LACTATED RINGERS IV SOLN
INTRAVENOUS | Status: DC
  Administered 2016-03-04: 09:00:00 via INTRAVENOUS

## 2016-03-04 MED ORDER — ACETAMINOPHEN 10 MG/ML IV SOLN
INTRAVENOUS | Status: DC | PRN
  Administered 2016-03-04: 1000 mg via INTRAVENOUS

## 2016-03-04 MED ORDER — PANTOPRAZOLE SODIUM 40 MG PO TBEC
40.0000 mg | DELAYED_RELEASE_TABLET | Freq: Every day | ORAL | Status: DC
  Administered 2016-03-05 – 2016-03-06 (×2): 40 mg via ORAL
  Filled 2016-03-04 (×2): qty 1

## 2016-03-04 MED ORDER — SODIUM CHLORIDE 0.9 % IV SOLN
1.0000 g | Freq: Every day | INTRAVENOUS | Status: DC
  Administered 2016-03-04 – 2016-03-06 (×3): 1 g via INTRAVENOUS
  Filled 2016-03-04 (×4): qty 1

## 2016-03-04 MED ORDER — SUCCINYLCHOLINE CHLORIDE 20 MG/ML IJ SOLN
INTRAMUSCULAR | Status: DC | PRN
  Administered 2016-03-04: 120 mg via INTRAVENOUS

## 2016-03-04 MED ORDER — ONDANSETRON HCL 4 MG/2ML IJ SOLN
INTRAMUSCULAR | Status: DC | PRN
  Administered 2016-03-04: 4 mg via INTRAVENOUS

## 2016-03-04 SURGICAL SUPPLY — 40 items
APPLIER CLIP 5 13 M/L LIGAMAX5 (MISCELLANEOUS)
BLADE SURG SZ10 CARB STEEL (BLADE) ×3 IMPLANT
BULB RESERV EVAC DRAIN JP 100C (MISCELLANEOUS) ×3 IMPLANT
CANISTER SUCT 1200ML W/VALVE (MISCELLANEOUS) ×3 IMPLANT
CATH TRAY 16F METER LATEX (MISCELLANEOUS) ×3 IMPLANT
CHLORAPREP W/TINT 26ML (MISCELLANEOUS) ×3 IMPLANT
CLIP APPLIE 5 13 M/L LIGAMAX5 (MISCELLANEOUS) IMPLANT
DRAIN CHANNEL JP 19F (MISCELLANEOUS) ×3 IMPLANT
DRAPE UTILITY 15X26 TOWEL STRL (DRAPES) ×9 IMPLANT
ELECT CAUTERY BLADE 6.4 (BLADE) ×3 IMPLANT
ELECT REM PT RETURN 9FT ADLT (ELECTROSURGICAL) ×3
ELECTRODE REM PT RTRN 9FT ADLT (ELECTROSURGICAL) ×1 IMPLANT
GLOVE BIOGEL PI IND STRL 7.0 (GLOVE) ×1 IMPLANT
GLOVE BIOGEL PI INDICATOR 7.0 (GLOVE) ×2
GLOVE PI ORTHOPRO 6.5 (GLOVE) ×4
GLOVE PI ORTHOPRO STRL 6.5 (GLOVE) ×2 IMPLANT
GLOVE SURG SYN 7.0 (GLOVE) ×3 IMPLANT
HANDLE YANKAUER SUCT BULB TIP (MISCELLANEOUS) ×3 IMPLANT
IRRIGATION STRYKERFLOW (MISCELLANEOUS) ×1 IMPLANT
IRRIGATOR STRYKERFLOW (MISCELLANEOUS) ×3
IV NS 1000ML (IV SOLUTION) ×2
IV NS 1000ML BAXH (IV SOLUTION) ×1 IMPLANT
KIT RM TURNOVER STRD PROC AR (KITS) ×3 IMPLANT
LABEL OR SOLS (LABEL) ×3 IMPLANT
LAPSAC TISSUE POUCH 5X8 (MISCELLANEOUS) ×3 IMPLANT
NDL SAFETY 22GX1.5 (NEEDLE) ×3 IMPLANT
PACK LAP CHOLECYSTECTOMY (MISCELLANEOUS) ×3 IMPLANT
PENCIL ELECTRO HAND CTR (MISCELLANEOUS) ×3 IMPLANT
RETRACTOR WOUND ALXS 18CM SML (MISCELLANEOUS) IMPLANT
RTRCTR WOUND ALEXIS O 18CM SML (MISCELLANEOUS)
SCALPEL HARMONIC ACE (MISCELLANEOUS) ×3 IMPLANT
SHEARS HARMONIC ACE PLUS 36CM (ENDOMECHANICALS) ×3 IMPLANT
SLEEVE ENDOPATH XCEL 5M (ENDOMECHANICALS) ×9 IMPLANT
SPONGE LAP 18X18 5 PK (GAUZE/BANDAGES/DRESSINGS) IMPLANT
SUT MNCRL AB 4-0 PS2 18 (SUTURE) ×3 IMPLANT
SUT PDS 2-0 27IN (SUTURE) IMPLANT
SUT VICRYL 0 AB UR-6 (SUTURE) ×3 IMPLANT
TROCAR XCEL BLUNT TIP 100MML (ENDOMECHANICALS) ×3 IMPLANT
TROCAR XCEL NON-BLD 5MMX100MML (ENDOMECHANICALS) ×3 IMPLANT
TUBING INSUFFLATOR HEATED (MISCELLANEOUS) ×3 IMPLANT

## 2016-03-04 NOTE — Anesthesia Procedure Notes (Signed)
Procedure Name: Intubation Date/Time: 03/04/2016 9:26 AM Performed by: Irving BurtonBACHICH, Graylen Noboa Pre-anesthesia Checklist: Emergency Drugs available, Patient identified, Suction available and Patient being monitored Patient Re-evaluated:Patient Re-evaluated prior to inductionOxygen Delivery Method: Circle system utilized Preoxygenation: Pre-oxygenation with 100% oxygen Intubation Type: IV induction Ventilation: Mask ventilation with difficulty and Oral airway inserted - appropriate to patient size Laryngoscope Size: Glidescope and 4 Grade View: Grade I Tube type: Oral Tube size: 7.5 mm Number of attempts: 1 Airway Equipment and Method: Video-laryngoscopy and Rigid stylet Placement Confirmation: ETT inserted through vocal cords under direct vision,  positive ETCO2 and breath sounds checked- equal and bilateral Secured at: 23 cm Tube secured with: Tape Dental Injury: Injury to lip and Teeth and Oropharynx as per pre-operative assessment  Difficulty Due To: Difficulty was anticipated Future Recommendations: Recommend- induction with short-acting agent, and alternative techniques readily available

## 2016-03-04 NOTE — Brief Op Note (Signed)
03/04/2016  12:27 PM  PATIENT:  Danny Klein  55 y.o. male  PRE-OPERATIVE DIAGNOSIS:  INTRA ABDOMINAL iNFECTION  POST-OPERATIVE DIAGNOSIS:  RETAINED INTERABDOMINAL GALLSTONE  PROCEDURE:  Procedure(s): LAPAROSCOPY DIAGNOSTIC, REMOVAL OF RETAINED GALLSTONE (N/A)  SURGEON:  Surgeon(s) and Role:    * Gladis Riffleatherine L Virgie Chery, MD - Primary  PHYSICIAN ASSISTANT:   ASSISTANTS: none   ANESTHESIA:   general  EBL:  Total I/O In: -  Out: 275 [Urine:250; Emesis/NG output:25]  BLOOD ADMINISTERED:none  DRAINS: (36F) Jackson-Pratt drain(s) with closed bulb suction in the RUQ   LOCAL MEDICATIONS USED:  MARCAINE     SPECIMEN:  Source of Specimen:  RUQ inflammatory fat  DISPOSITION OF SPECIMEN:  PATHOLOGY  COUNTS:  YES  TOURNIQUET:  * No tourniquets in log *  DICTATION: .Dragon Dictation  PLAN OF CARE: Admit for overnight observation  PATIENT DISPOSITION:  PACU - hemodynamically stable.   Delay start of Pharmacological VTE agent (>24hrs) due to surgical blood loss or risk of bleeding: not applicable

## 2016-03-04 NOTE — Care Management (Signed)
Patient admitted post op for observation. Patient lives at home with his wife. PCP = [facility].  [facility] [facility] [place].  No issues with obtaining medications or transportation. Patient currently open with [facility] for nursing IV antibiotic infusion. Plan for patient to discharge tomorrow on a new IV antibiotic Invanz.  Resumption of home health orders have been placed.  Barbara with [facility] notified.  Hard copy of scrip on chart for Invanz.  RNCM following

## 2016-03-04 NOTE — H&P (View-Only) (Signed)
Danny DownsSamuel V Klein is an 55 y.o. male.   Chief Complaint: abdominal pain HPI: [age]yr old male with abdominal pain in the RUQ/lateral abdomen.  Patient has been dealing with this pain for about 6 months after having a Lap chole.  He has been admitted multiple times for this pain and was recently admitted and been on IV Zosyn at home.  Yesterday he started to have the pain in the same area again, along with some increased fatigue and malaise. Patient states some decrease in appetite as well but no vomiting.  He denies any fever as well.  He has been getting his Zosyn at home and home health has been visiting.  The patient is very frustrated and at time even tearful.   Past Medical History:  Diagnosis Date  . Hypertension     Past Surgical History:  Procedure Laterality Date  . CARDIAC CATHETERIZATION  2009  . CHOLECYSTECTOMY    . SPINAL FUSION W/ LUQUE UNIT ROD  2013    Family History  Problem Relation Age of Onset  . Cancer Mother   . Heart disease Mother   . Heart disease Father    Social History:  reports that he has never smoked. He has never used smokeless tobacco. He reports that he drinks about 0.6 oz of alcohol per week . He reports that he does not use drugs.  Allergies:  Allergies  Allergen Reactions  . Levofloxacin     Tendinitis/Severe Tendinopathy    . Hydrocodone Itching and Other (See Comments)    Made him feel violent  . Meperidine Nausea And Vomiting and Other (See Comments)    Nausea and severe vomiting , talking out of head      (Not in a hospital admission)  No results found for this or any previous visit (from the past 48 hour(s)). Ct Abdomen Pelvis W Contrast  Result Date: 03/03/2016 CLINICAL DATA:  Follow-up evaluation after drainage 02/04/2016. Continued pain. EXAM: CT ABDOMEN AND PELVIS WITH CONTRAST TECHNIQUE: Multidetector CT imaging of the abdomen and pelvis was performed using the standard protocol following bolus administration of intravenous  contrast. CONTRAST:  100mL ISOVUE-300 IOPAMIDOL (ISOVUE-300) INJECTION 61% COMPARISON:  02/09/2016, 02/18/2016 FINDINGS: Lower chest: Normal heart size. No pericardial effusion. Bibasilar atelectasis. Hepatobiliary: Persistent ill-defined soft tissue along the inferior margin of the right hepatic lobe with inflammatory stranding which has improved compared with the prior examination. No drainable fluid collection. Liver is otherwise normal. Prior cholecystectomy. Pancreas: Normal. Spleen: Normal. Adrenals/Urinary Tract: Normal adrenal glands. Postoperative changes along the inferior pole of the left kidney. Kidneys are otherwise normal. No urolithiasis or obstructive uropathy. Normal bladder. Stomach/Bowel: No bowel wall thickening or dilatation. Normal appendix. Diverticulosis without evidence of diverticulitis. No pneumatosis, pneumoperitoneum or portal venous gas. No abdominal or pelvic free fluid. Vascular/Lymphatic: Normal caliber abdominal aorta. No lymphadenopathy. Other: No fluid collection or hematoma. Musculoskeletal: No acute osseous abnormality. No aggressive lytic or sclerotic osseous lesion. Posterior spinal fusion at L4-5. Severe degenerative disc disease with disc height loss at L5-S1. IMPRESSION: 1. Persistent ill-defined soft tissue along the inferior margin of the right hepatic lobe with inflammatory stranding which has improved compared with the prior examination. Electronically Signed   By: Elige KoHetal  Patel   On: 03/03/2016 09:13    Review of Systems  Constitutional: Positive for malaise/fatigue and weight loss. Negative for chills, diaphoresis and fever.  HENT: Negative for congestion, ear pain and sore throat.   Respiratory: Negative for cough, hemoptysis, sputum production, shortness of breath  and wheezing.   Cardiovascular: Negative for chest pain, palpitations, orthopnea, claudication, leg swelling and PND.  Gastrointestinal: Positive for abdominal pain and nausea. Negative for blood in  stool, constipation, diarrhea, heartburn and vomiting.  Genitourinary: Positive for flank pain. Negative for dysuria, frequency, hematuria and urgency.  Musculoskeletal: Negative for back pain, joint pain and neck pain.  Skin: Negative for itching and rash.  Neurological: Negative for dizziness, loss of consciousness and weakness.  Psychiatric/Behavioral: Negative for depression. The patient is not nervous/anxious.   All other systems reviewed and are negative.   Blood pressure 113/76, pulse (!) 103, temperature 98.6 F (37 C), temperature source Oral, height 5\' 11"  (1.803 m), weight 241 lb 6.4 oz (109.5 kg). Physical Exam  Vitals reviewed. Constitutional: He is oriented to person, place, and time. He appears well-developed and well-nourished. No distress.  HENT:  Head: Normocephalic and atraumatic.  Right Ear: External ear normal.  Left Ear: External ear normal.  Nose: Nose normal.  Mouth/Throat: Oropharynx is clear and moist. No oropharyngeal exudate.  Eyes: Conjunctivae and EOM are normal. Pupils are equal, round, and reactive to light. No scleral icterus.  Neck: Normal range of motion. Neck supple. No tracheal deviation present.  Cardiovascular: Normal rate, regular rhythm, normal heart sounds and intact distal pulses.  Exam reveals no gallop and no friction rub.   No murmur heard. Respiratory: Effort normal and breath sounds normal. No respiratory distress. He has no wheezes. He has no rales.  GI: Soft. Bowel sounds are normal. He exhibits no distension and no mass. There is tenderness. There is guarding. There is no rebound.  Right upper quadrant and right lateral pain   Musculoskeletal: Normal range of motion. He exhibits no edema, tenderness or deformity.  Neurological: He is alert and oriented to person, place, and time. No cranial nerve deficit.  Skin: Skin is warm and dry. No rash noted. No erythema. No pallor.  Psychiatric: He has a normal mood and affect. His behavior is  normal. Judgment and thought content normal.     Assessment/Plan 55 yr old male with intraabdominal abscess after having lap chole back in April.  I have reviewed all the patients past admissions since that time as well as the notes from [facility].  I have reviewed his most recent laboratory values as well as his fromt he past few admissions, with a normal WBC.  I have personally reviewed his CT scan images to include today's scan as well as 5 other CT scans all which show this area of inflammation, phelgmon just lateral to the liver.    The risks, benefits, complications, treatment options, and expected outcomes were discussed with the patient. The possibilities of bleeding, recurrent infection, inability to diagnose the problem, perforation of viscus organs, damage to surrounding structures, abscess formation, needing a drain placed, the need for additional procedures, reaction to medication, pulmonary aspiration,  failure to diagnose a condition, the possible need to convert to an open procedure, and creating a complication requiring transfusion or operation were discussed with the patient. The patient and concurred with the proposed plan, giving informed consent. I will schedule him for Diagnostic Laparoscopy possible laparotomy tomorrow.    Gladis Riffle, MD 03/03/2016, 4:17 PM

## 2016-03-04 NOTE — Interval H&P Note (Signed)
History and Physical Interval Note:  03/04/2016 9:08 AM  Danny Klein  has presented today for surgery, with the diagnosis of INTRA ABDOMINAL ABSCESS  The various methods of treatment have been discussed with the patient and family. After consideration of risks, benefits and other options for treatment, the patient has consented to  Procedure(s): LAPAROSCOPY DIAGNOSTIC (N/A) as a surgical intervention .  The patient's history has been reviewed, patient examined, no change in status, stable for surgery.  I have reviewed the patient's chart and labs.  Questions were answered to the patient's satisfaction.     Rafel Garde L Anothy Bufano

## 2016-03-04 NOTE — Op Note (Signed)
Diagnostic Laparoscopy Procedure Note  Indications: 55 year old male who had his gallbladder removed 6 months ago at an outside hospital and signed interabdominal abscess in the lateral part of the liver and chronic pain in the area. The patient has been on IV antibiotics and IV antibiotics at home as well as attempted to have this area drained and is continued to worsen. It is felt this time the only option is to take a look laparoscopically and see what is the cause of the inflammation.  Pre-operative Diagnosis: Intra-abdominal abscess  Post-operative Diagnosis: 1. Intra-abdominal abscess 2. Retained gallstone  Surgeon: Gladis Riffleatherine L Advit Trethewey   Assistants: none  Anesthesia: General endotracheal anesthesia  ASA Class: 2  Procedure Details  The patient was seen again in the Holding Room. The risks, benefits, complications, treatment options, and expected outcomes were discussed with the patient. The possibilities of reaction to medication, pulmonary aspiration, perforation of viscus, bleeding, recurrent infection, finding a normal gallbladder, the need for additional procedures, failure to diagnose a condition, the possible need to convert to an open procedure, and creating a complication requiring transfusion or operation were discussed with the patient. The patient and/or family concurred with the proposed plan, giving informed consent. The site of surgery properly noted/marked. The patient was taken to Operating Room, identified as Danny Klein and the procedure verified as Laparoscopic Cholecystectomy with Intraoperative Cholangiograms. A Time Out was held and the above information confirmed.  Prior to the induction of general anesthesia, antibiotic prophylaxis was administered. General endotracheal anesthesia was then administered and tolerated well. After the induction, the abdomen was prepped in the usual sterile fashion. The patient was positioned in the supine position with the left arm  comfortably tucked, along with some reverse Trendelenburg.  Local anesthetic agent was injected into the skin near the umbilicus and an incision made. The midline fascia was incised and the Hasson technique was used to introduce a 10 mm port under direct vision. It was secured with a figure of eight Vicryl suture placed in the usual fashion. Pneumoperitoneum was then created with CO2 and tolerated well without any adverse changes in the patient's vital signs. Four additional trocars were introduced under direct vision. All skin incisions were infiltrated with a local anesthetic agent before making the incision and placing the trocars.   There were multiple adhesions up above the liver there were photo documented. Blunt dissection was then used to carefully dissect the omentum and adhesions away from the liver. Laparoscopic liver retractors were used to retract the omentum medially in the liver superiorly and dissection was carried down to the lateral posterior edge of the liver which had dense adhesions and very thick fat. There is obviously inflamed areas in here and a pocket of pus cavity was opened and a retained gallstone was removed. The retained stone was photo documented and removed from the abdomen.  The remainder of the thick inflamed fat was inspected and the harmonic scalpel was used to resect this area approximately a 3 cm area of fat. This is an placed in Endo Catch bag and was removed. The fat was opened on the back table to ensure no other retained stones in this area but was just chronic inflammatory fat. This specimen was sent to pathology. Copious irrigation was utilized and was repeatedly aspirated until clear ofl particulate matter. A 19 [place]French JP drain was then placed under direct visualization through the most lateral 5 mm trocar.  Pneumoperitoneum was completely reduced after viewing removal of the trocars under  direct vision. The wound was thoroughly irrigated and the fascia was then  closed with a figure of eight suture; the skin was then closed with 4-0 Monocryl and sterile glue was applied.  Instrument, sponge, and needle counts were correct at closure and at the conclusion of the case.   Findings: Abscess of the lateral portion of the liver with a retained gallstone, Lasix then a JP drain in the right upper quadrant  Estimated Blood Loss: less than 50 mL         Drains: JP drain in RUQ         Total IV Fluids:         Specimens: Gallbladder           Complications: None; patient tolerated the procedure well.         Disposition: PACU - hemodynamically stable.         Condition: stable

## 2016-03-04 NOTE — Transfer of Care (Signed)
Immediate Anesthesia Transfer of Care Note  Patient: Danny DownsSamuel V Eades  Procedure(s) Performed: Procedure(s): LAPAROSCOPY DIAGNOSTIC (N/A)  Patient Location: PACU  Anesthesia Type:General  Level of Consciousness: awake  Airway & Oxygen Therapy: Patient connected to face mask oxygen  Post-op Assessment: Post -op Vital signs reviewed and stable  Post vital signs: stable  Last Vitals:  Vitals:   03/04/16 0751 03/04/16 1208  BP: 121/72 (!) 148/84  Pulse: 85 95  Resp: 18 (!) 26  Temp: 37.7 C 37.2 C    Last Pain:  Vitals:   03/04/16 1208  TempSrc: Temporal  PainSc:          Complications: No apparent anesthesia complications

## 2016-03-04 NOTE — Anesthesia Preprocedure Evaluation (Signed)
Anesthesia Evaluation  Patient identified by MRN, date of birth, ID band Patient awake    Reviewed: Allergy & Precautions, NPO status , Patient's Chart, lab work & pertinent test results  History of Anesthesia Complications (+) DIFFICULT AIRWAY and history of anesthetic complications (pt states his wife was told by anesthesiologist after lap chole that he was difficult intubation, previous anesthetic record [facility], grade IV with MAC 4, bougie attempted but unsuccessful, grade 1 videolaryngoscope)  Airway Mallampati: III  TM Distance: >3 FB Neck ROM: Full    Dental no notable dental hx.    Pulmonary sleep apnea , neg COPD,    breath sounds clear to auscultation- rhonchi (-) wheezing      Cardiovascular hypertension, Pt. on medications and Pt. on home beta blockers (-) CAD and (-) Past MI  Rhythm:Regular Rate:Normal - Systolic murmurs and - Diastolic murmurs    Neuro/Psych Anxiety negative neurological ROS     GI/Hepatic Neg liver ROS, GERD  ,  Endo/Other  negative endocrine ROSneg diabetes  Renal/GU negative Renal ROS     Musculoskeletal  (+) Arthritis , Osteoarthritis,    Abdominal (+) + obese,   Peds  Hematology negative hematology ROS (+)   Anesthesia Other Findings Past Medical History: No date: Abscess of abdominal wall No date: Arthritis No date: Chronic kidney disease     Comment: Kidney Stones No date: GERD (gastroesophageal reflux disease) No date: Hyperlipidemia No date: Hypertension No date: Sleep apnea     Comment: NO C-PAP   Reproductive/Obstetrics                             Anesthesia Physical Anesthesia Plan  ASA: III  Anesthesia Plan: General   Post-op Pain Management:    Induction: Intravenous  Airway Management Planned: Oral ETT and Video Laryngoscope Planned  Additional Equipment:   Intra-op Plan:    Post-operative Plan: Extubation in OR  Informed Consent: I have reviewed the patients History and Physical, chart, labs and discussed the procedure including the risks, benefits and alternatives for the proposed anesthesia with the patient or authorized representative who has indicated his/her understanding and acceptance.   Dental advisory given  Plan Discussed with: CRNA and Anesthesiologist  Anesthesia Plan Comments:         Anesthesia Quick Evaluation

## 2016-03-04 NOTE — Telephone Encounter (Signed)
Pt advised of pre op date/time and sx date. Sx: 03/04/16 with Dr Acey LavLoflin--Diagnotic laparotomy.  Pre op: 03/04/16-office.   No auth required with [employer].

## 2016-03-04 NOTE — Anesthesia Postprocedure Evaluation (Signed)
Anesthesia Post Note  Patient: Danny Klein  Procedure(s) Performed: Procedure(s) (LRB): LAPAROSCOPY DIAGNOSTIC, REMOVAL OF RETAINED GALLSTONE (N/A)  Patient location during evaluation: PACU Anesthesia Type: General Level of consciousness: awake and alert and oriented Pain management: pain level controlled Vital Signs Assessment: post-procedure vital signs reviewed and stable Respiratory status: spontaneous breathing, nonlabored ventilation and respiratory function stable Cardiovascular status: blood pressure returned to baseline and stable Postop Assessment: no signs of nausea or vomiting Anesthetic complications: no    Last Vitals:  Vitals:   03/04/16 1253 03/04/16 1308  BP: 138/79 (!) 150/79  Pulse: 85 76  Resp: 15 17  Temp:  37.1 C    Last Pain:  Vitals:   03/04/16 1308  TempSrc:   PainSc: 2                  Kimberl Vig

## 2016-03-05 DIAGNOSIS — K651 Peritoneal abscess: Secondary | ICD-10-CM | POA: Diagnosis not present

## 2016-03-05 LAB — COMPREHENSIVE METABOLIC PANEL
ALBUMIN: 2.8 g/dL — AB (ref 3.5–5.0)
ALK PHOS: 59 U/L (ref 38–126)
ALT: 26 U/L (ref 17–63)
AST: 26 U/L (ref 15–41)
Anion gap: 7 (ref 5–15)
BILIRUBIN TOTAL: 1.2 mg/dL (ref 0.3–1.2)
BUN: 12 mg/dL (ref 6–20)
CALCIUM: 8.9 mg/dL (ref 8.9–10.3)
CO2: 27 mmol/L (ref 22–32)
CREATININE: 0.62 mg/dL (ref 0.61–1.24)
Chloride: 102 mmol/L (ref 101–111)
GFR calc Af Amer: >60 mL/min (ref >60)
GFR calc non Af Amer: >60 mL/min (ref >60)
GLUCOSE: 110 mg/dL — AB (ref 65–99)
Potassium: 3.9 mmol/L (ref 3.5–5.1)
SODIUM: 136 mmol/L (ref 135–145)
Total Protein: 6.4 g/dL — ABNORMAL LOW (ref 6.5–8.1)

## 2016-03-05 LAB — CBC
HCT: 32.5 % — ABNORMAL LOW (ref 40.0–52.0)
Hemoglobin: 10.9 g/dL — ABNORMAL LOW (ref 13.0–18.0)
MCH: 28.1 pg (ref 26.0–34.0)
MCHC: 33.5 g/dL (ref 32.0–36.0)
MCV: 84 fL (ref 80.0–100.0)
PLATELETS: 210 10*3/uL (ref 150–440)
RBC: 3.86 MIL/uL — ABNORMAL LOW (ref 4.40–5.90)
RDW: 15.6 % — AB (ref 11.5–14.5)
WBC: 9.3 10*3/uL (ref 3.8–10.6)

## 2016-03-05 NOTE — Progress Notes (Signed)
Patient ID: Danny Klein, male   DOB: 10/04/1960, 55 y.o.   MRN: [id number] No complaints. Patient states the pain is better in the right upper quadrant. Labs are noted be stable with normal white count. Afebrile vital signs are stable Abdomen is soft with some mild to moderate tenderness upper quadrant. Port sites are clean. Good bowel sounds JP with serosanguineous drainage.Overall doing well. If home nursing set up to provide the IV Invanz starting on Monday he can likely be discharged tomorrow. Discussed fully with the patient and nursing

## 2016-03-05 NOTE — Progress Notes (Signed)
Initial Nutrition Assessment  DOCUMENTATION CODES:   Obesity unspecified  INTERVENTION:  -Cater to pt preferences; reviewed options on GI Soft diet.   NUTRITION DIAGNOSIS:   Inadequate oral intake related to poor appetite, altered GI function as evidenced by per patient/family report.  GOAL:   Patient will meet greater than or equal to 90% of their needs  MONITOR:   PO intake, Labs, Weight trends  REASON FOR ASSESSMENT:   Malnutrition Screening Tool    ASSESSMENT:    55 yo male admitted with intraabdominal abscess from retained gallstone s/p surgical removal on 9/1. Pt with lap cholecystectomy 6 months ago  Diet advanced to Soft this AM; pt tolerated FL diet well. Pt reports appetite continues to improve. Pt reports  he weighed 283 pounds prior to 1st surgery 4.5 months ago; 10.6% wt loss. Appetite fluctuated, some days he ate well, others only bites and sips. Pt also reports he was trying to eat healthier post surgery so some of the wt loss was intentional. Pt reports appetite recently has been improved and he has actually gained a pound or 2.  Nutrition-Focused physical exam completed. Findings are WDL for fat depletion, muscle depletion, and edema.   Past Medical History:  Diagnosis Date  . Abscess of abdominal wall   . Arthritis   . Chronic kidney disease    Kidney Stones  . GERD (gastroesophageal reflux disease)   . Hyperlipidemia   . Hypertension   . Sleep apnea    NO C-PAP    Diet Order:  DIET SOFT Room service appropriate? Yes; Fluid consistency: Thin  Skin:  Reviewed, no issues  Last BM:  9/1  Labs: reviewed  Meds: LR at 50 ml/hr, florastor  Height:   Ht Readings from Last 1 Encounters:  03/04/16 5\' 11"  (1.803 m)    Weight:   Wt Readings from Last 1 Encounters:  03/04/16 253 lb 14.4 oz (115.2 kg)    Filed Weights   03/04/16 1532  Weight: 253 lb 14.4 oz (115.2 kg)   Wt Readings from Last 10 Encounters:  03/04/16 253 lb 14.4 oz (115.2  kg)  03/03/16 241 lb (109.3 kg)  03/03/16 241 lb 6.4 oz (109.5 kg)  02/21/16 242 lb (109.8 kg)  02/18/16 242 lb 11.2 oz (110.1 kg)  02/09/16 239 lb (108.4 kg)  02/03/16 239 lb (108.4 kg)  02/03/16 249 lb (112.9 kg)  01/28/16 250 lb (113.4 kg)    BMI:  Body mass index is 35.41 kg/m.  Estimated Nutritional Needs:   Kcal:  2100-2400 kcals   Protein:  105-120 g  Fluid:  >/= 2 L  EDUCATION NEEDS:   Education needs addressed  Romelle Starcherate Taran Hable MS, RD, LDN [phone] Pager  [phone] Weekend/On-Call Pager

## 2016-03-05 NOTE — Care Management Note (Signed)
Case Management Note  Patient Details  Name: Danny Klein MRN: [id number] Date of Birth: 10/16/1960  Subjective/Objective:     Referral for IV Invanz faxed to the [facility], and also reported to the [facility]ist via phone conversation. Referral also sent to [facility] requesting home health RN.                Action/Plan:   Expected Discharge Date:                  Expected Discharge Plan:     In-House Referral:     Discharge planning Services     Post Acute Care Choice:    Choice offered to:     DME Arranged:    DME Agency:     HH Arranged:    HH Agency:     Status of Service:     If discussed at [employer] Length of Stay Meetings, dates discussed:    Additional Comments:  Lizann Edelman A, RN 03/05/2016, 3:01 PM

## 2016-03-06 DIAGNOSIS — K651 Peritoneal abscess: Secondary | ICD-10-CM | POA: Diagnosis not present

## 2016-03-06 NOTE — Care Management Note (Signed)
Case Management Note  Patient Details  Name: Danny DownsSamuel V Klein MRN: [id number] Date of Birth: 08/12/1960  Subjective/Objective:   Referral for home health RN faxed to [facility] today. Request that RN be at the home tomorrow to teach/reinforce [name] or family to administer the daily V Ancef dose.  Spoke with Jonny RuizJohn at the [facility]. [facility] prior to the 2pm home dose of Ancef.                 Action/Plan:   Expected Discharge Date:                  Expected Discharge Plan:     In-House Referral:     Discharge planning Services     Post Acute Care Choice:    Choice offered to:     DME Arranged:    DME Agency:     HH Arranged:    HH Agency:     Status of Service:     If discussed at [employer] Length of Stay Meetings, dates discussed:    Additional Comments:  Karry Barrilleaux A, RN 03/06/2016, 10:16 AM

## 2016-03-06 NOTE — Final Progress Note (Signed)
Patient with no complains on 11 4 soreness in the right upper abdominal area. He is afebrile and vital signs are stable. JP drain was still with some moderate serosanguineous drainage. Abdomen is otherwise soft with good bowel sounds. Lungs are clear.   Home health arrangement and has been made for him to receive his Invanz daily. Located discharge. He is advised to follow up in 2-3 days with the Dr. Orvis BrillLoflin or her associates.

## 2016-03-08 LAB — SURGICAL PATHOLOGY

## 2016-03-09 ENCOUNTER — Ambulatory Visit (INDEPENDENT_AMBULATORY_CARE_PROVIDER_SITE_OTHER): Payer: BC Managed Care – PPO | Admitting: Surgery

## 2016-03-09 ENCOUNTER — Encounter: Payer: Self-pay | Admitting: Surgery

## 2016-03-09 VITALS — BP 114/76 | HR 90 | Temp 98.3°F | Wt 245.0 lb

## 2016-03-09 DIAGNOSIS — R1011 Right upper quadrant pain: Secondary | ICD-10-CM

## 2016-03-09 MED ORDER — OXYCODONE HCL 5 MG PO TABS: 5.0000 mg | ORAL_TABLET | ORAL | 0 refills | Status: DC | PRN

## 2016-03-09 NOTE — Progress Notes (Signed)
Outpatient Surgical Follow Up  03/09/2016  Danny Klein is an 55 y.o. male.   CC: Right upper quadrant abscess  HPI: This patient underwent a diagnostic laparoscopy and laparoscopic clean out of the right upper quadrant with drain placement. This was following a laparoscopic cholecystectomy where apparently a stone was retained in the right upper quadrant causing an abscess.  Currently he feels better but is having considerable pain he has had some low-grade fevers earlier in the week but none since he is on IV Invanz and is utilizing oral analgesics of a narcotic nature.  Past Medical History:  Diagnosis Date  . Abscess of abdominal wall   . Arthritis   . Chronic kidney disease    Kidney Stones  . GERD (gastroesophageal reflux disease)   . Hyperlipidemia   . Hypertension   . Sleep apnea    NO C-PAP    Past Surgical History:  Procedure Laterality Date  . CARDIAC CATHETERIZATION  2009  . CHOLECYSTECTOMY    . LAMINECTOMY    . LAPAROSCOPY N/A 03/04/2016   Procedure: LAPAROSCOPY DIAGNOSTIC, REMOVAL OF RETAINED GALLSTONE;  Surgeon: Gladis Riffle, MD;  Location: [facility] ORS;  Service: General;  Laterality: N/A;  . PICC LINE PLACE PERIPHERAL ([facility] HX) Left 02/12/2016  . SPINAL FUSION W/ LUQUE UNIT ROD  2013    Family History  Problem Relation Age of Onset  . Cancer Mother   . Heart disease Mother   . Heart disease Father     Social History:  reports that he has never smoked. He has never used smokeless tobacco. He reports that he drinks about 0.6 oz of alcohol per week . He reports that he does not use drugs.  Allergies:  Allergies  Allergen Reactions  . Levofloxacin     Tendinitis/Severe Tendinopathy    . Hydrocodone Itching and Other (See Comments)    Made him feel violent  . Meperidine Nausea And Vomiting and Other (See Comments)    Nausea and severe vomiting , talking out of head     Medications reviewed.   Review of Systems:   Review of Systems   Constitutional: Positive for fever. Negative for chills.  HENT: Negative.   Gastrointestinal: Positive for abdominal pain. Negative for nausea and vomiting.  Skin: Negative.      Physical Exam:  BP 114/76   Pulse 90   Temp 98.3 F (36.8 C) (Oral)   Wt 245 lb (111.1 kg)   BMI 34.17 kg/m   Physical Exam  Constitutional: He is well-developed, well-nourished, and in no distress. No distress.  Pulmonary/Chest: Effort normal. No respiratory distress.  Abdominal: Soft. He exhibits no distension. There is tenderness. There is no rebound and no guarding.  Serous fluid in drain  Musculoskeletal: Normal range of motion. He exhibits no edema or tenderness.  Skin: He is not diaphoretic.  Vitals reviewed.     No results found for this or any previous visit (from the past 48 hour(s)). No results found.  Assessment/Plan:  Status post laparoscopy for right upper quadrant abscess which was drained. His pathology confirmed the presence of inflammatory process. Lee culture I could find was from August 18 where fluid failed to grow any pathologic organism.  At this point I would recommend continuing his Invanz via the PICC line and leaving the drain in place for now. I will ask the patient to see Dr. Orvis Brill in 10 days at which time she can make a decision about removing the drain and stopping  the Invanz. At this point with the patient's continuing low-grade fevers I would not remove the drain at this point.  Lattie Hawichard E Cooper, MD, FACS

## 2016-03-14 ENCOUNTER — Other Ambulatory Visit: Payer: Self-pay

## 2016-03-21 ENCOUNTER — Ambulatory Visit (INDEPENDENT_AMBULATORY_CARE_PROVIDER_SITE_OTHER): Payer: BC Managed Care – PPO | Admitting: Surgery

## 2016-03-21 ENCOUNTER — Encounter: Payer: Self-pay | Admitting: Surgery

## 2016-03-21 VITALS — BP 133/67 | HR 88 | Temp 98.1°F | Ht 71.0 in | Wt 242.0 lb

## 2016-03-21 DIAGNOSIS — R1011 Right upper quadrant pain: Secondary | ICD-10-CM

## 2016-03-21 DIAGNOSIS — K651 Peritoneal abscess: Secondary | ICD-10-CM

## 2016-03-21 DIAGNOSIS — K8020 Calculus of gallbladder without cholecystitis without obstruction: Secondary | ICD-10-CM

## 2016-03-21 DIAGNOSIS — K9189 Other postprocedural complications and disorders of digestive system: Secondary | ICD-10-CM

## 2016-03-21 DIAGNOSIS — IMO0002 Reserved for concepts with insufficient information to code with codable children: Secondary | ICD-10-CM

## 2016-03-21 DIAGNOSIS — K9186 Retained cholelithiasis following cholecystectomy: Secondary | ICD-10-CM

## 2016-03-21 NOTE — Progress Notes (Signed)
55 year old male who had a retained gallstone after a laparoscopic cholecystectomy done 6 months prior. The patient is now 2 weeks out from that operation and has been on IV Invanz once daily at home. The patient is done with the Invanz as of today. His right upper quadrant drain has only been putting out about 20 mL per day of a serous fluid.  Patient states that he still has some pain with taking a deep breath coughing or sneezing but feels like it's all centered around the drain as opposed to the deep pain from previous.  Vitals:   03/21/16 0843  BP: 133/67  Pulse: 88  Temp: 98.1 F (36.7 C)   PE:  Gen: NAD  Abd: soft, mildly tender in RUQ around JP site, jp serous minimal, removed today.    A/P:  Remove the patient's JP drain today and he is still been doing well without any fevers or chills. We were able to get the fax of labs were his white count was normal at 5.6 but he did have a slightly elevated platelet count of 456. Patient also had a elevated CRP and sedimentation rate and these were done a week ago. I kept the patient's PICC line in for now and will get another set of labs to ensure that they're coming back to normal. If these do come back as normal we will have him come in for nurse visit to remove the PICC line. Patient is also instructed if he were to get any of the abdominal pain that he had previously to call us however will have him follow-up with provider when necessary.

## 2016-03-22 ENCOUNTER — Other Ambulatory Visit: Payer: Self-pay | Admitting: Surgery

## 2016-03-22 ENCOUNTER — Telehealth: Payer: Self-pay

## 2016-03-22 ENCOUNTER — Encounter: Payer: Self-pay | Admitting: Surgery

## 2016-03-22 DIAGNOSIS — R1011 Right upper quadrant pain: Secondary | ICD-10-CM

## 2016-03-22 NOTE — Telephone Encounter (Signed)
Called and spoke to patient and asked if he was told that he needed to have blood work. He stated that he did. Patient also stated that his home health nurse went to his house yesterday and drew his blood. I then asked if there was any way that I could contact his home health nurse. He gave me her name and number: Renee RivalSherry Miles; [phone]. I called Cordelia PenSherry from [facility] and asked if she by any chance had drawn a CBC and a CRP on patient. She stated that she did. I asked her if there was any way that I could get a copy since we were depending those labs to see if we could remove patient's drain. She understood and stated that she would fax us the results. Awaiting on results.

## 2016-03-23 NOTE — Telephone Encounter (Signed)
Called [facility] and requested patient's labs to be faxed to our office. They stated that they would. Awaiting on lab results to show Dr. Orvis BrillLoflin so she could make the decision in removing his drain.

## 2016-03-25 ENCOUNTER — Telehealth: Payer: Self-pay | Admitting: Surgery

## 2016-03-25 NOTE — Telephone Encounter (Signed)
Labs obtained at this time. Will review with Dr. Orvis BrillLoflin and call patient with recommendations.

## 2016-03-25 NOTE — Telephone Encounter (Signed)
Called Renee RivalSherry Miles, RN from [facility] to let her know that I have not received patient's labs that were requested 3 days ago. She stated that she would call her office and request for them to fax it again to our [place] office. Awaiting for lab results.

## 2016-03-25 NOTE — Telephone Encounter (Signed)
Did you get results from blood work so he can get pic line out?

## 2016-03-25 NOTE — Telephone Encounter (Signed)
Called patient to let him know that we were finally able to obtain his labs. I also told him that Dr. Orvis BrillLoflin was able to review his labs and she is okay for us to remove his PICC line. I then called patient to let him know and I told him that we would be able to remove it on Monday 03/28/2016. Patient agreed.

## 2016-03-28 ENCOUNTER — Ambulatory Visit (INDEPENDENT_AMBULATORY_CARE_PROVIDER_SITE_OTHER): Payer: BC Managed Care – PPO

## 2016-03-28 VITALS — BP 134/77 | HR 91 | Temp 98.8°F | Ht 71.0 in | Wt 244.0 lb

## 2016-03-28 DIAGNOSIS — Z452 Encounter for adjustment and management of vascular access device: Secondary | ICD-10-CM

## 2016-03-28 NOTE — Patient Instructions (Signed)
You are doing great! Please call with any questions or concerns that you may have.

## 2016-03-28 NOTE — Telephone Encounter (Signed)
Dr. Orvis BrillLoflin wanted patient's PICC line to be removed. He was told about a nurse visit on Friday 03/25/2016 and [facility]. Everything seemed to be great.

## 2016-03-31 NOTE — Progress Notes (Signed)
PICC line has been ordered for removal per Dr. Orvis BrillLoflin after seeing latest lab results.   Patient came into office today to have this done. PICC removed without difficulty and was intact. Pressure was held at insertion site for 5 minutes after pulling the line. No blood loss. Dry dressing placed over the site.   Patient was educated on signs and symptoms of infection and was encouraged to call with any questions or concerns. He verbalized understanding.

## 2016-04-02 ENCOUNTER — Ambulatory Visit
Admission: EM | Admit: 2016-04-02 | Discharge: 2016-04-02 | Disposition: A | Discharge status: Home / Self Care | Payer: BC Managed Care – PPO | Attending: Family Medicine | Admitting: Family Medicine

## 2016-04-02 DIAGNOSIS — M5431 Sciatica, right side: Secondary | ICD-10-CM | POA: Diagnosis not present

## 2016-04-02 MED ORDER — TIZANIDINE HCL 4 MG PO TABS: 4.0000 mg | ORAL_TABLET | Freq: Four times a day (QID) | ORAL | 0 refills | Status: DC | PRN

## 2016-04-02 MED ORDER — KETOROLAC TROMETHAMINE 60 MG/2ML IM SOLN
60.0000 mg | Freq: Once | INTRAMUSCULAR | Status: AC
  Administered 2016-04-02: 60 mg via INTRAMUSCULAR

## 2016-04-02 MED ORDER — NAPROXEN 500 MG PO TABS: 500.0000 mg | ORAL_TABLET | Freq: Two times a day (BID) | ORAL | 0 refills | Status: DC

## 2016-04-02 NOTE — ED Provider Notes (Signed)
CSN: [id number]     Arrival date & time 04/02/16  0803 History   First MD Initiated Contact with Patient 04/02/16 [phone]     Chief Complaint  Patient presents with  . Leg Pain    right  . Back Pain   (Consider location/radiation/quality/duration/timing/severity/associated sxs/prior Treatment) HPI  55 year old male who presents with sided low back pain radiating into his leg posterior thigh calf to the level of his ankle. 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 He statesThat he has just recovered from gallbladder surgery that eventually formed abscess requiring additional surgery for drainage and evacuation of the abscess followed by a PICC line for IV antibiotics. That has kept him from work. He states that he has a part-time job farming but has not been doing any physical labor and does not remember any activity that may have precipitated the back pain. Is a history of back problems including spinal fusion performed in 2013. He states that he has had minor back problems throughout that time particularly over the last 3 weeks but Wednesday it seemed to flare and last night was the most that he's had so far. He denies any incontinence. He states that the pain will wax and wane and he is sometimes able to find comfortable positions.       Past Medical History:  Diagnosis Date  . Abscess of abdominal wall   . Arthritis   . Chronic kidney disease    Kidney Stones  . GERD (gastroesophageal reflux disease)   . Hyperlipidemia   . Hypertension   . Sleep apnea    NO C-PAP   Past Surgical History:  Procedure Laterality Date  . CARDIAC CATHETERIZATION  2009  . CHOLECYSTECTOMY    . LAMINECTOMY    . LAPAROSCOPY N/A 03/04/2016   Procedure: LAPAROSCOPY DIAGNOSTIC, REMOVAL OF RETAINED GALLSTONE;  Surgeon: Gladis Riffle, MD;  Location: [facility] ORS;  Service: General;  Laterality: N/A;  . PICC LINE PLACE PERIPHERAL ([facility] HX) Left 02/12/2016  .  SPINAL FUSION W/ LUQUE UNIT ROD  2013   Family History  Problem Relation Age of Onset  . Cancer Mother   . Heart disease Mother   . Heart disease Father    Social History  Substance Use Topics  . Smoking status: Never Smoker  . Smokeless tobacco: Never Used  . Alcohol use 0.6 oz/week    1 Cans of beer per week     Comment: drinks occassionally; 1 can beer per week    Review of Systems  Constitutional: Positive for activity change. Negative for chills, fatigue and fever.  Musculoskeletal: Positive for back pain.  All other systems reviewed and are negative.   Allergies  Levofloxacin; Hydrocodone; and Meperidine  Home Medications   Prior to Admission medications   Medication Sig Start Date End Date Taking? Authorizing Provider  Cyanocobalamin (RA VITAMIN B-12 TR) 1000 MCG TBCR Take 1 tablet by mouth daily.    Yes Historical Provider, MD  fluticasone (FLONASE) 50 MCG/ACT nasal spray Place 1 spray into both nostrils daily.   Yes Historical Provider, MD  lisinopril-hydrochlorothiazide (PRINZIDE,ZESTORETIC) 20-25 MG tablet Take 1 tablet by mouth every other day.    Yes Historical Provider, MD  pantoprazole (PROTONIX) 40 MG tablet Take 40 mg by mouth daily.  02/14/16  Yes Historical Provider, MD  rosuvastatin (CRESTOR) 20 MG tablet Take 20 mg by mouth every evening.  12/10/15  Yes Historical Provider, MD  naproxen (NAPROSYN) 500 MG tablet Take 1 tablet (500 mg total) by mouth 2 (two) times daily with a meal. 04/02/16   Lutricia FeilWilliam P Canaan Holzer, PA-C  tiZANidine (ZANAFLEX) 4 MG tablet Take 1 tablet (4 mg total) by mouth every 6 (six) hours as needed for muscle spasms. 04/02/16   Lutricia FeilWilliam P Jullien Granquist, PA-C   Meds Ordered and Administered this Visit   Medications  ketorolac (TORADOL) injection 60 mg (60 mg Intramuscular Given 04/02/16 0840)    BP 136/69 (BP Location: Left Arm)   Pulse 77   Temp 97.8 F (36.6 C) (Tympanic)   Resp 16   Ht 5\' 11"  (1.803 m)   Wt 242 lb (109.8 kg)   SpO2 98%   BMI  33.75 kg/m  No data found.   Physical Exam  Constitutional: He is oriented to person, place, and time. He appears well-developed and well-nourished. No distress.  HENT:  Head: Normocephalic and atraumatic.  Eyes: EOM are normal. Pupils are equal, round, and reactive to light.  Neck: Normal range of motion. Neck supple.  Musculoskeletal:  Examination of the lumbar spine shows the pelvis to be elevated on the left. She is able to forward flex to his hands at knee level. Resuming erect posture is painful. Lateral flexion is intact bilaterally and is comfortable. The patient is able to toe and heel walk. Sensation is intact to light touch throughout in the lower extremities. EHL peroneal and anterior tibialis muscles are strong to clinical testing. DTRs are 2+ over 4 and bilaterally symmetrical. Straight leg raise testing in the seated position is positive at approximately 80 with low back and buttock pain.  Neurological: He is alert and oriented to person, place, and time. He has normal reflexes. He displays normal reflexes. He exhibits normal muscle tone. Coordination normal.  Skin: Skin is warm and dry. He is not diaphoretic.  Psychiatric: He has a normal mood and affect. His behavior is normal. Judgment and thought content normal.  Nursing note and vitals reviewed.   Urgent Care Course   Clinical Course    Procedures (including critical care time)  Labs Review Labs Reviewed - No data to display  Imaging Review No results found.   Visual Acuity Review  Right Eye Distance:   Left Eye Distance:   Bilateral Distance:    Right Eye Near:   Left Eye Near:    Bilateral Near:     Medications  ketorolac (TORADOL) injection 60 mg (60 mg Intramuscular Given 04/02/16 0840)     MDM   1. Sciatica of right side    New Prescriptions   NAPROXEN (NAPROSYN) 500 MG TABLET    Take 1 tablet (500 mg total) by mouth 2 (two) times daily with a meal.   TIZANIDINE (ZANAFLEX) 4 MG TABLET     Take 1 tablet (4 mg total) by mouth every 6 (six) hours as needed for muscle spasms.  Plan: 1. Test/x-ray results and diagnosis reviewed with patient 2. rx as per orders; risks, benefits, potential side effects reviewed with patient 3. Recommend supportive treatment with Symptom avoidance. I have advised the patient that he should not be in active as this will cause some deconditioned. Recommended that he walk frequently and when he is resting to lie recumbent.. Should follow-up with his primary care physician next week. His return to work will be judged by his primary care physician. If he has increased pain or has any loss of function immediately to the emergency department. 4. F/u prn if  symptoms worsen or don't improve     Lutricia Feil, PA-C 04/02/16 0901    Lutricia Feil, PA-C 04/02/16 [phone]

## 2016-04-02 NOTE — ED Triage Notes (Signed)
Patient complains of right sciatica pain that started 3 weeks ago and states that this worsened yesterday. Patient states that he has having shooting pain from low back down to his ankle.

## 2016-04-04 ENCOUNTER — Telehealth: Payer: Self-pay

## 2016-04-04 NOTE — Telephone Encounter (Signed)
Courtesy call back completed today for patients recent visit at [facility]. Patient did not answer, left message on voicemail to call back with any questions or concerns.   

## 2016-05-05 NOTE — Discharge Summary (Signed)
Physician Discharge Summary  Patient ID: Danny Klein MRN: [id number] DOB/AGE: 57/08/1960 57 y.o.  Admit date: 03/04/2016 Discharge date: 03/06/2016  Admission Diagnoses:intra abdominal infection  Discharge Diagnoses:  Active Problems:   Intra-abdominal infection   Discharged Condition: good  Hospital Course: He had diagnostic lap and removed a retained gallstone.  He did well in the hospital and was discharged with the same home IV antibiotics  Consults: None  Significant Diagnostic Studies: none  Treatments: surgery:Diagnostic Lap  Discharge Exam: Blood pressure (!) 151/64, pulse (!) 102, temperature 98.9 F (37.2 C), temperature source Oral, resp. rate 18, height 5\' 11"  (1.803 m), weight 253 lb 14.4 oz (115.2 kg), SpO2 94 %. Please see note from last surgeon to see him while in the hospital on 9/3.   Disposition: 01-Home or Self Care  Discharge Instructions    Call MD for:  redness, tenderness, or signs of infection (pain, swelling, redness, odor or green/yellow discharge around incision site)    Complete by:  As directed    Call MD for:  severe uncontrolled pain    Complete by:  As directed    Diet - low sodium heart healthy    Complete by:  As directed    Discharge instructions    Complete by:  As directed    Empty drain 2-3times a day and record amount   Increase activity slowly    Complete by:  As directed        Medication List    TAKE these medications   fluticasone 50 MCG/ACT nasal spray Commonly known as:  FLONASE Place 1 spray into both nostrils daily.   lisinopril-hydrochlorothiazide 20-25 MG tablet Commonly known as:  PRINZIDE,ZESTORETIC Take 1 tablet by mouth every other day.   pantoprazole 40 MG tablet Commonly known as:  PROTONIX Take 40 mg by mouth daily.   RA VITAMIN B-12 TR 1000 MCG Tbcr Generic drug:  Cyanocobalamin Take 1 tablet by mouth daily.   rosuvastatin 20 MG tablet Commonly known as:  CRESTOR Take 20 mg by mouth every  evening.      Follow-up Information    Dionne Miloichard Cooper, MD Follow up on 03/09/2016.   Specialty:  Surgery Why:  F/u with Dr. Excell Seltzerooper for drain removal on 03/09/2016 at 10:30am Contact information: [address] [place] [address] [phone]           Signed: Gladis RiffleCatherine L Amberlie Gaillard 05/05/2016, 1:36 PM

## 2016-08-05 ENCOUNTER — Other Ambulatory Visit
Admission: RE | Admit: 2016-08-05 | Discharge: 2016-08-05 | Disposition: A | Discharge status: Home / Self Care | Payer: BC Managed Care – PPO | Source: Ambulatory Visit | Attending: Surgery | Admitting: Surgery

## 2016-08-05 ENCOUNTER — Telehealth: Payer: Self-pay | Admitting: Surgery

## 2016-08-05 DIAGNOSIS — R1011 Right upper quadrant pain: Secondary | ICD-10-CM | POA: Insufficient documentation

## 2016-08-05 LAB — CBC WITH DIFFERENTIAL/PLATELET
Basophils Absolute: 0 K/uL (ref 0–0.1)
Basophils Relative: 0 %
Eosinophils Absolute: 0.1 K/uL (ref 0–0.7)
Eosinophils Relative: 2 %
HCT: 42.9 % (ref 40.0–52.0)
Hemoglobin: 14.5 g/dL (ref 13.0–18.0)
Lymphocytes Relative: 19 %
Lymphs Abs: 1.2 K/uL (ref 1.0–3.6)
MCH: 28.7 pg (ref 26.0–34.0)
MCHC: 33.9 g/dL (ref 32.0–36.0)
MCV: 84.6 fL (ref 80.0–100.0)
Monocytes Absolute: 0.6 K/uL (ref 0.2–1.0)
Monocytes Relative: 10 %
Neutro Abs: 4.5 K/uL (ref 1.4–6.5)
Neutrophils Relative %: 69 %
Platelets: 210 K/uL (ref 150–440)
RBC: 5.07 MIL/uL (ref 4.40–5.90)
RDW: 14 % (ref 11.5–14.5)
WBC: 6.4 K/uL (ref 3.8–10.6)

## 2016-08-05 LAB — COMPREHENSIVE METABOLIC PANEL WITH GFR
ALT: 18 U/L (ref 17–63)
AST: 20 U/L (ref 15–41)
Albumin: 4.1 g/dL (ref 3.5–5.0)
Alkaline Phosphatase: 108 U/L (ref 38–126)
Anion gap: 7 (ref 5–15)
BUN: 21 mg/dL — ABNORMAL HIGH (ref 6–20)
CO2: 26 mmol/L (ref 22–32)
Calcium: 9.1 mg/dL (ref 8.9–10.3)
Chloride: 107 mmol/L (ref 101–111)
Creatinine, Ser: 0.89 mg/dL (ref 0.61–1.24)
GFR calc Af Amer: >60 mL/min
GFR calc non Af Amer: >60 mL/min
Glucose, Bld: 89 mg/dL (ref 65–99)
Potassium: 4.1 mmol/L (ref 3.5–5.1)
Sodium: 140 mmol/L (ref 135–145)
Total Bilirubin: 1.3 mg/dL — ABNORMAL HIGH (ref 0.3–1.2)
Total Protein: 7.3 g/dL (ref 6.5–8.1)

## 2016-08-05 MED ORDER — AMOXICILLIN-POT CLAVULANATE 875-125 MG PO TABS: 1.0000 | ORAL_TABLET | Freq: Two times a day (BID) | ORAL | 0 refills | Status: DC

## 2016-08-05 NOTE — Telephone Encounter (Signed)
Low grade fever, hurting in the same area where he had the infection. Pain 1-2 most times but spiking to an 8 at times. No redness.

## 2016-08-05 NOTE — Telephone Encounter (Signed)
Returned phone call to patient. Began having RUQ pain last week and has become worse by the day. The last 2-3 days have become intermittently severe. Has also had temperature of 99.4-99.6. Tmax of 99.6. Denies cough and congestion. Pain does increase worse when he takes a deep breath in. Denies warmth at skin. Pain feels deep per patient and feels exactly like pain prior to last surgery. Denies nausea or vomiting. Bowels moving normally. LBM was this am and was normal.  Was on Augmentin prior and felt better on this but as soon as he would stop antibiotics, symptoms would recur.  Spoke with Dr. Tonita CongWoodham regarding this patient, he has ordered STAT CBC, CMP, and Lipase. Patient will follow-up in clinic on Monday morning, 08/08/16 at 1000am with Dr. Aleen CampiPiscoya. Patient has also been started on Augmentin 875-125mg  1 tablet BID x 10 days. All orders placed.  Appointment made and patient has been made aware of all orders. He was also encouraged to go to the ED over the weekend if pain become severe or constant, if he developed Nausea and vomiting, or fever >100.5.

## 2016-08-08 ENCOUNTER — Encounter: Payer: Self-pay | Admitting: Surgery

## 2016-08-08 ENCOUNTER — Ambulatory Visit (INDEPENDENT_AMBULATORY_CARE_PROVIDER_SITE_OTHER): Payer: BC Managed Care – PPO | Admitting: Surgery

## 2016-08-08 ENCOUNTER — Telehealth: Payer: Self-pay

## 2016-08-08 ENCOUNTER — Ambulatory Visit
Admission: RE | Admit: 2016-08-08 | Discharge: 2016-08-08 | Disposition: A | Discharge status: Home / Self Care | Payer: BC Managed Care – PPO | Source: Ambulatory Visit | Attending: Surgery | Admitting: Surgery

## 2016-08-08 VITALS — BP 141/94 | HR 88 | Temp 98.3°F | Ht 71.0 in | Wt 258.2 lb

## 2016-08-08 DIAGNOSIS — R1011 Right upper quadrant pain: Secondary | ICD-10-CM | POA: Diagnosis not present

## 2016-08-08 DIAGNOSIS — K5790 Diverticulosis of intestine, part unspecified, without perforation or abscess without bleeding: Secondary | ICD-10-CM | POA: Diagnosis not present

## 2016-08-08 MED ORDER — IOPAMIDOL (ISOVUE-300) INJECTION 61%
125.0000 mL | Freq: Once | INTRAVENOUS | Status: AC | PRN
  Administered 2016-08-08: 125 mL via INTRAVENOUS

## 2016-08-08 MED ORDER — CYCLOBENZAPRINE HCL 5 MG PO TABS: 5.0000 mg | ORAL_TABLET | Freq: Three times a day (TID) | ORAL | 0 refills | Status: DC | PRN

## 2016-08-08 NOTE — Telephone Encounter (Signed)
The results are in EPIC.  1. No Acute abdominal or pelvic pathology 2. Diverticulosis without diverticulitis  3. Mild scarring right lateral mid abdomen at the site of prior sub hepatic abscess   [facility]. He did not stay, please contact patient and advice.

## 2016-08-08 NOTE — Patient Instructions (Signed)
We are obtaining a CT of your Abdomen and Pelvis to find a cause for your abdominal pain. Please go to the Outpatient Imaging Center at 1:30pm to begin drinking your contrast. Do not eat or drink anything prior to your scan.   The outpatient imaging center is off of [address]. The address to this location is: [address], [place], [place] [address]. The number to this location in case you are lost is, [phone].  We will call you with results of the scan and plan going forward as soon as the results are available.

## 2016-08-08 NOTE — Progress Notes (Addendum)
Addendum:  CT scan reviewed.  There is no new abscess in the RUQ region.  There is very mild scarring in the RUQ where his previous abscess was but there is no new collection or stranding noted.  Appendix is retrocecal but normal in appearance.  No kidney stones noted.    --Recommend continuing empiric Augmentin until course completed. --Will add Flexeril in case of any musculoskeletal involvement. --Pt will follow up in a month to re-evaluate his pain.   Danny Neth, MD [facility]  ------------------------------------  08/08/2016  History of Present Illness: Danny Klein is a 56 y.o. male s/p laparoscopic cholecystectomy in April 2017, and s/p laparoscopic drainage of intra-abdominal abscess due to a retained stone in September 2017.  He had a PICC line in place and completed a course of Invanz and Augmentin.  Since surgery, he had been feeling well without further RUQ pain.  However, last week started having again some discomfort over the RUQ similar to his symptoms prior to his surgery in September and called the office for further evaluation.  He was empirically started on Augmentin and labs were ordered which showed normal WBC of 6.4 with no left shift.  However, today he continues having abdominal pain.  Denies any nausea or lack of appetite, denies any fever or chills, denies any constipation.  He works as a Development worker, community but denies any strenuous activity or trauma that may have caused this pain.  Has a history of kidney stones but reports this pain is not similar to his prior kidney stone symptoms, but more like the pain related to the retained gallstone.  Past Medical History: Past Medical History:  Diagnosis Date  . Abscess of abdominal wall   . Arthritis   . Chronic kidney disease    Kidney Stones  . GERD (gastroesophageal reflux disease)   . Hyperlipidemia   . Hypertension   . Sleep apnea    NO C-PAP     Past Surgical History: Past Surgical  History:  Procedure Laterality Date  . CARDIAC CATHETERIZATION  2009  . CHOLECYSTECTOMY    . LAMINECTOMY    . LAPAROSCOPY N/A 03/04/2016   Procedure: LAPAROSCOPY DIAGNOSTIC, REMOVAL OF RETAINED GALLSTONE;  Surgeon: Hubbard Robinson, MD;  Location: [facility] ORS;  Service: General;  Laterality: N/A;  . PICC LINE PLACE PERIPHERAL ([facility] HX) Left 02/12/2016  . SPINAL FUSION W/ LUQUE UNIT ROD  2013    Home Medications: Prior to Admission medications   Medication Sig Start Date End Date Taking? Authorizing Provider  amoxicillin-clavulanate (AUGMENTIN) 875-125 MG tablet Take 1 tablet by mouth 2 (two) times daily. 08/05/16  Yes Clayburn Pert, MD  fluticasone [facility]) 50 MCG/ACT nasal spray Place 1 spray into both nostrils daily.   Yes Historical Provider, MD  lisinopril-hydrochlorothiazide (PRINZIDE,ZESTORETIC) 20-25 MG tablet Take 1 tablet by mouth every other day.    Yes Historical Provider, MD  rosuvastatin (CRESTOR) 20 MG tablet Take 20 mg by mouth every evening.  12/10/15  Yes Historical Provider, MD    Allergies: Allergies  Allergen Reactions  . Hydrocodone Itching and Other (See Comments)    Made him feel violent  . Meperidine Nausea And Vomiting and Other (See Comments)    Nausea and severe vomiting , talking out of head   . Levofloxacin Other (See Comments)    Tendinitis/Severe Tendinopathy      Social History:  reports that he has never smoked. He has never used smokeless tobacco. He reports that he drinks about  0.6 oz of alcohol per week . He reports that he does not use drugs.   Family History: Family History  Problem Relation Age of Onset  . Cancer Mother   . Heart disease Mother   . Heart disease Father     Review of Systems: Review of Systems  Constitutional: Negative for chills and fever.  HENT: Negative for hearing loss.   Eyes: Negative for blurred vision.  Respiratory: Negative for cough and shortness of breath.   Cardiovascular: Negative for chest pain and leg  swelling.  Gastrointestinal: Positive for abdominal pain. Negative for blood in stool, constipation, diarrhea, heartburn, nausea and vomiting.  Genitourinary: Negative for dysuria and hematuria.  Musculoskeletal: Negative for myalgias.  Skin: Negative for rash.  Neurological: Negative for dizziness.  Psychiatric/Behavioral: Negative for depression.  All other systems reviewed and are negative.   Physical Exam BP (!) 141/94   Pulse 88   Temp 98.3 F (36.8 C) (Oral)   Ht 5' 11"  (1.803 m)   Wt 117.1 kg (258 lb 3.2 oz)   BMI 36.01 kg/m  CONSTITUTIONAL: No acute distress HEENT:  Normocephalic, atraumatic, extraocular motion intact. NECK: Trachea is midline, and there is no jugular venous distension.  RESPIRATORY:  Lungs are clear with no respiratory distress. CARDIOVASCULAR: Heart is regular without murmurs, gallops, or rubs. GI: The abdomen is soft, obese, nondistended, with tenderness to palpation in the right upper lateral area.  There is no ecchymosis or trauma visible to that area. There were no palpable masses.  All his prior incisions are well healed.   MUSCULOSKELETAL:  Normal muscle strength and tone in all four extremities.  No peripheral edema or cyanosis. SKIN: Skin turgor is normal. There are no pathologic skin lesions.  NEUROLOGIC:  Motor and sensation is grossly normal.  Cranial nerves are grossly intact. PSYCH:  Alert and oriented to person, place and time. Affect is normal.  Labs/Imaging: WBC 6.4, Na 140, K 4.1, Cl 107, BUN 21, Cr 0.89, Tbili 1.3, AST 20, ALT 18, Alk phos 108.  Assessment and Plan: This is a 56 y.o. male who presents with right upper quadrant abdominal pain.  --Given the patient's history and symptoms to be similar to his symptoms prior to surgery, and in the same location, will order CT scan to better evaluate for any area of inflammation.  His WBC is normal but on prior occasions it had not been significantly elevated either.  He is still having pain  this morning. --Pending on results, discussed with the patient that if CT is negative, he may have a musculoskeletal issue which we can add Flexeril to his medications.  If there's an abscess, he may require drainage.  Will inform the patient of results after CT is done. --Patient agrees with this plan and all of his questions have been answered.  CT will be done today.   Danny Klein, [place]

## 2016-08-08 NOTE — Telephone Encounter (Signed)
Spoke with Dr. Aleen CampiPiscoya in regards to patient. CT is negative for new abscess but with patient's current symptoms surgeon would like for patient to continue the Augmentin until it is finished. He has also ordered Flexeril- this was [facility]. Follow-up scheduled for 1 month to check on status of patient at that time.  Call has been made to patient at this time. Results reviewed and all information above was given. Encouraged patient to call if he has a worsening in symptoms prior to follow-up appointment. Patient verbalized information.

## 2016-08-09 ENCOUNTER — Ambulatory Visit: Payer: BC Managed Care – PPO

## 2016-09-01 ENCOUNTER — Ambulatory Visit: Payer: Self-pay | Admitting: Surgery

## 2016-09-19 IMAGING — CT CT ABD-PELV W/ CM
2 of 5 series · 16 of 46 positions shown, 18 images · IV contrast (APPLIED)
Comparison: 02/09/2016, 02/18/2016

CLINICAL DATA: Follow-up evaluation after drainage 02/04/2016.
Continued pain.

EXAM:
CT ABDOMEN AND PELVIS WITH CONTRAST
TECHNIQUE: Multidetector CT imaging of the abdomen and pelvis was performed
using the standard protocol following bolus administration of
intravenous contrast.
CONTRAST:  100mL 8JLF88-JII IOPAMIDOL (8JLF88-JII) INJECTION 61%

[Series 2: axial st · axial · 0.78mm/px · z∈[-509,-74]mm · 13 of 99 slices shown, 15 images]
[im 6/99  soft-tissue]
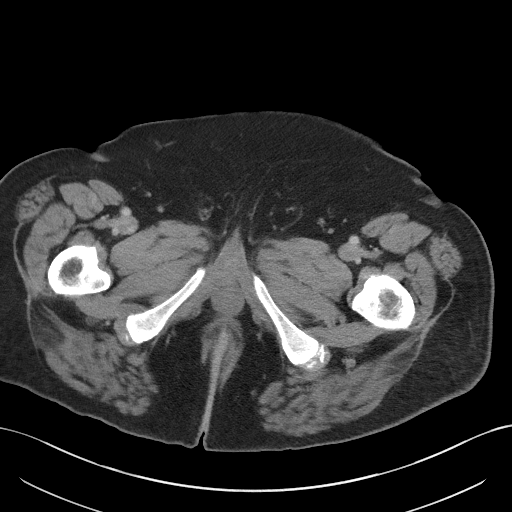
[im 6/99  bone]
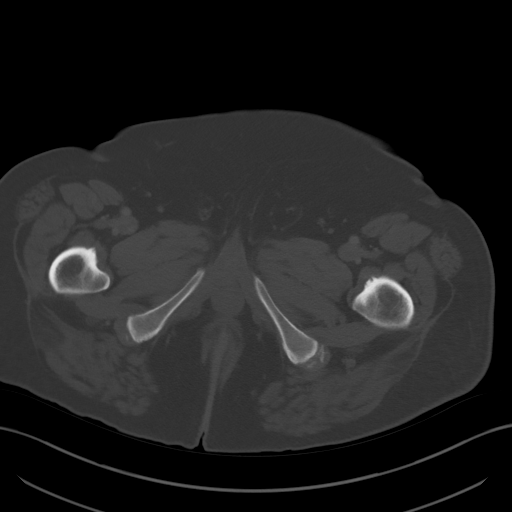
[im 11/99  soft-tissue]
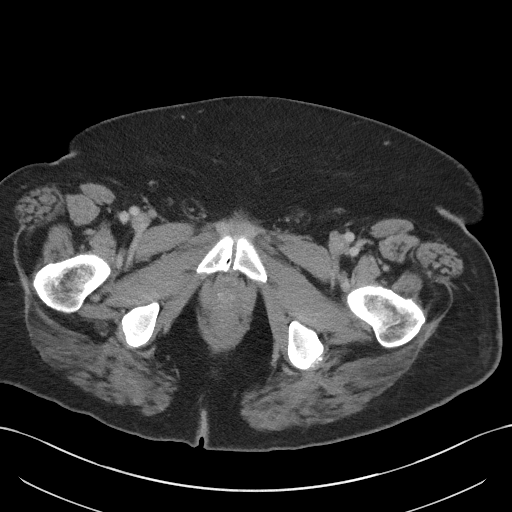
[im 22/99  soft-tissue]
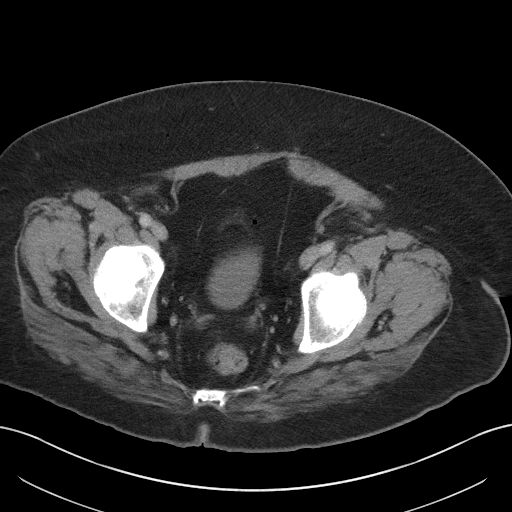
[im 28/99  soft-tissue]
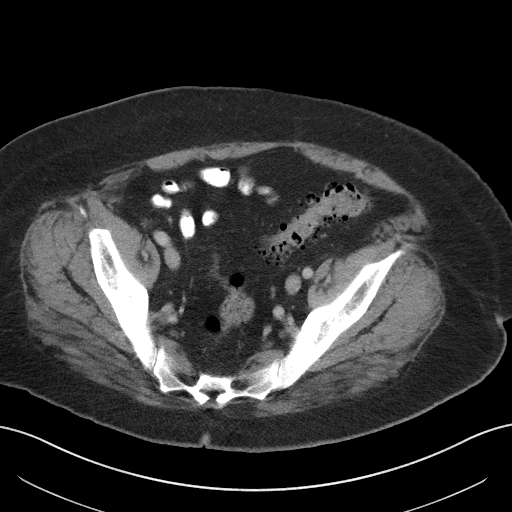
[im 33/99  soft-tissue]
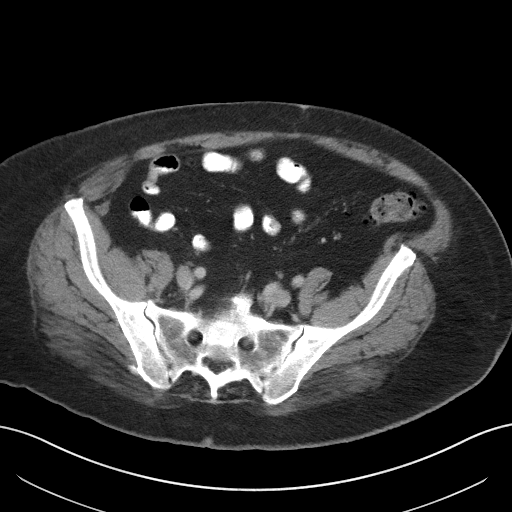
[im 44/99  soft-tissue]
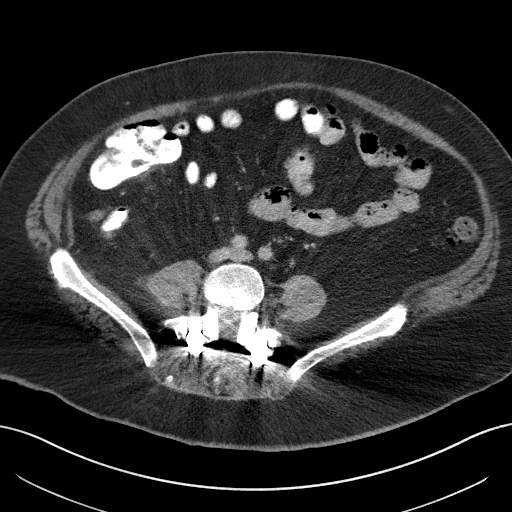
[im 50/99  soft-tissue]
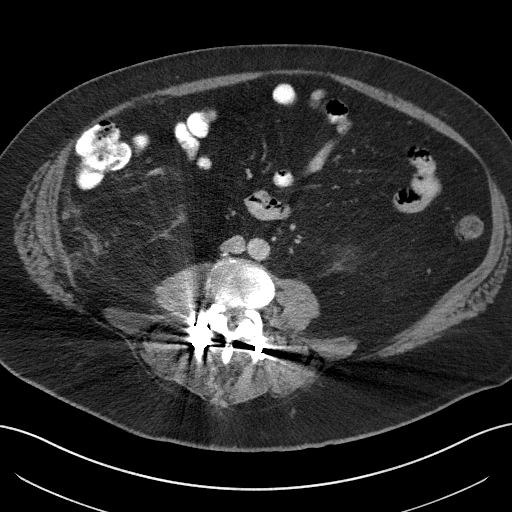
[im 55/99  soft-tissue]
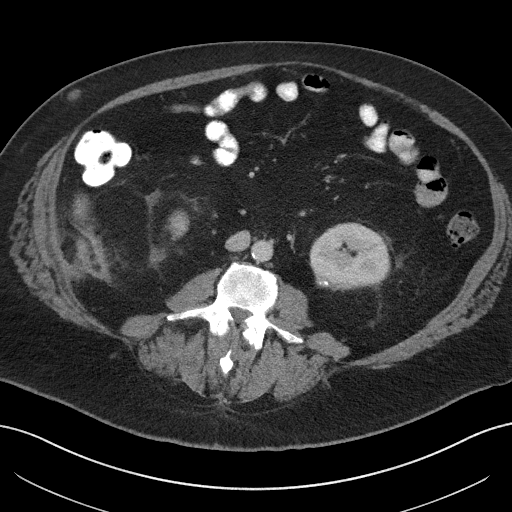
[im 66/99  soft-tissue]
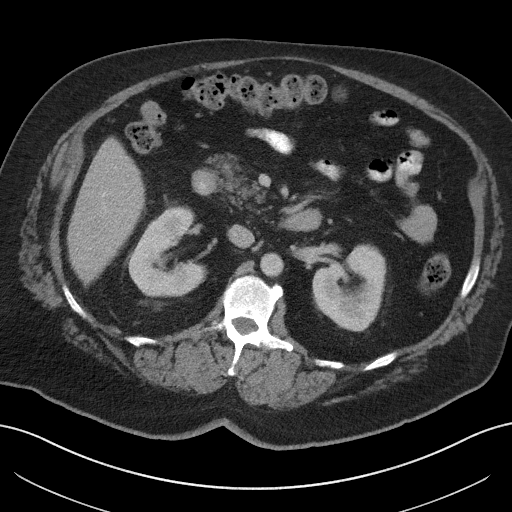
[im 66/99  bone]
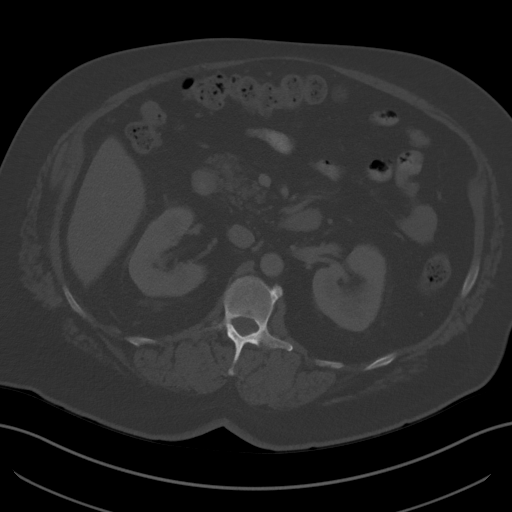
[im 71/99  soft-tissue]
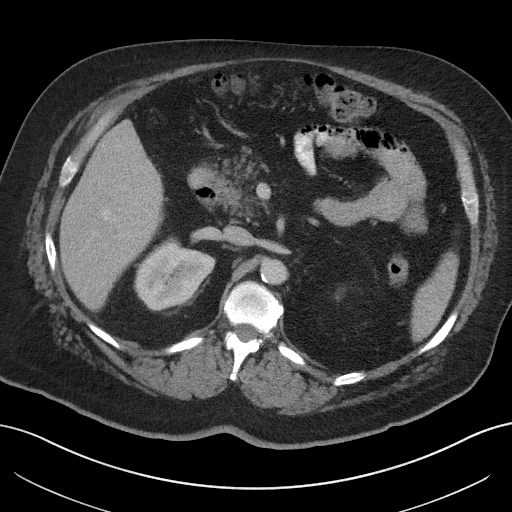
[im 77/99  soft-tissue]
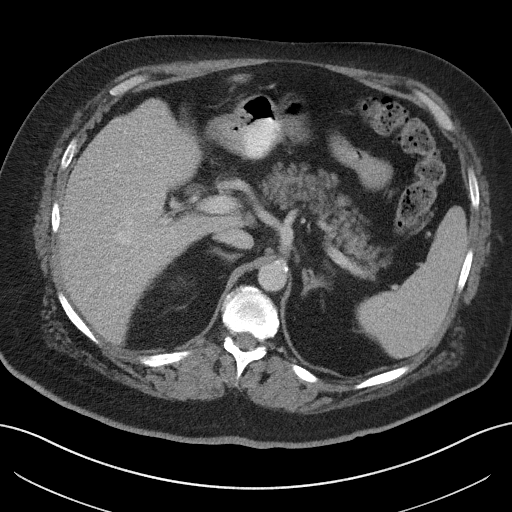
[im 88/99  soft-tissue]
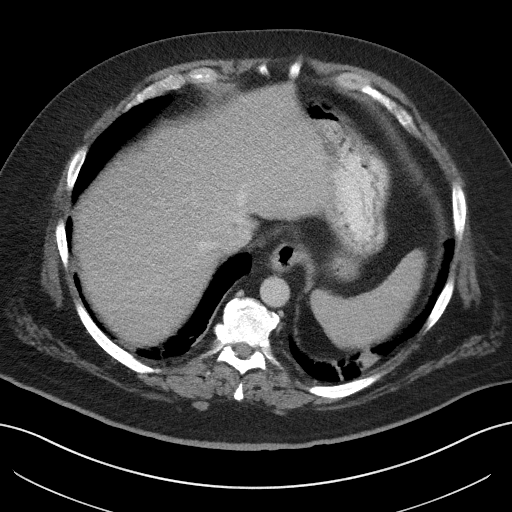
[im 93/99  soft-tissue]
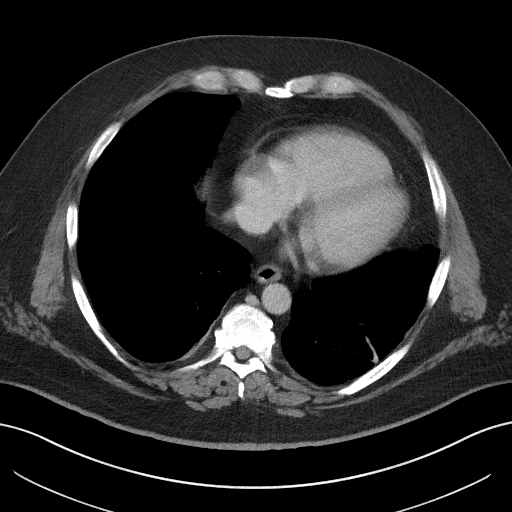

[Series 5: coronal st · coronal · 0.85mm/px · 3 of 104 slices shown]
[im 35/104  soft-tissue]
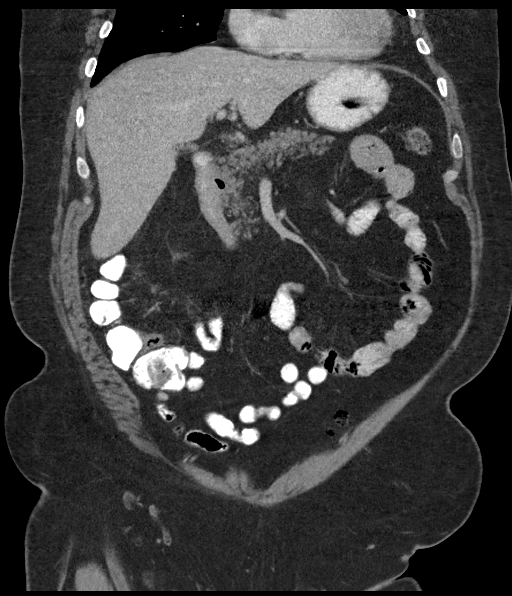
[im 46/104  soft-tissue]
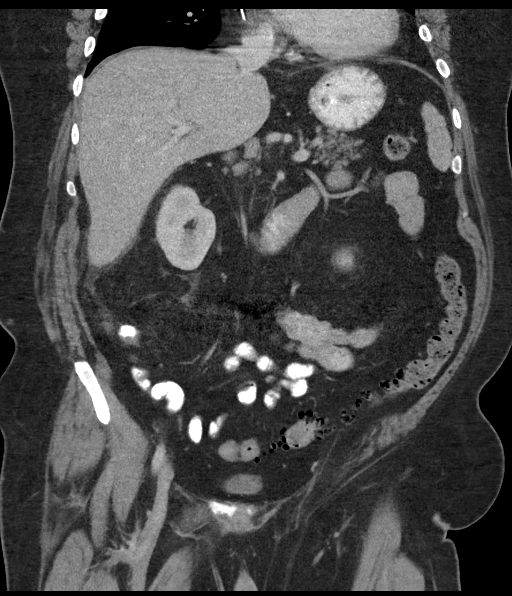
[im 58/104  soft-tissue]
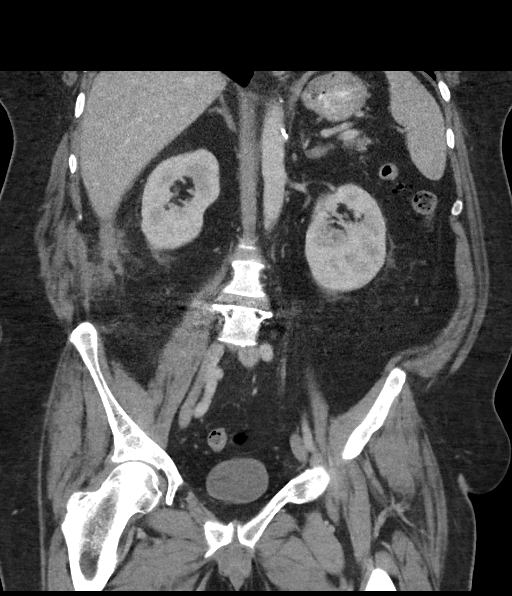

[16 of 46 positions shown; findings below may reference images not displayed]

FINDINGS: Lower chest: Normal heart size. No pericardial effusion. Bibasilar
atelectasis.

Hepatobiliary: Persistent ill-defined soft tissue along the inferior
margin of the right hepatic lobe with inflammatory stranding which
has improved compared with the prior examination. No drainable fluid
collection. Liver is otherwise normal. Prior cholecystectomy.

Pancreas: Normal.

Spleen: Normal.

Adrenals/Urinary Tract: Normal adrenal glands. Postoperative changes
along the inferior pole of the left kidney. Kidneys are otherwise
normal. No urolithiasis or obstructive uropathy. Normal bladder.

Stomach/Bowel: No bowel wall thickening or dilatation. Normal
appendix. Diverticulosis without evidence of diverticulitis. No
pneumatosis, pneumoperitoneum or portal venous gas. No abdominal or
pelvic free fluid.

Vascular/Lymphatic: Normal caliber abdominal aorta. No
lymphadenopathy.

Other: No fluid collection or hematoma.

Musculoskeletal: No acute osseous abnormality. No aggressive lytic
or sclerotic osseous lesion. Posterior spinal fusion at L4-5. Severe
degenerative disc disease with disc height loss at L5-S1.
IMPRESSION: 1. Persistent ill-defined soft tissue along the inferior margin of
the right hepatic lobe with inflammatory stranding which has
improved compared with the prior examination.

## 2017-03-19 ENCOUNTER — Emergency Department: Payer: BC Managed Care – PPO

## 2017-03-19 ENCOUNTER — Encounter: Payer: Self-pay | Admitting: Emergency Medicine

## 2017-03-19 ENCOUNTER — Emergency Department
Admission: EM | Admit: 2017-03-19 | Discharge: 2017-03-19 | Disposition: A | Discharge status: Home / Self Care | Payer: BC Managed Care – PPO | Attending: Student in an Organized Health Care Education/Training Program | Admitting: Student in an Organized Health Care Education/Training Program

## 2017-03-19 DIAGNOSIS — W010XXA Fall on same level from slipping, tripping and stumbling without subsequent striking against object, initial encounter: Secondary | ICD-10-CM | POA: Insufficient documentation

## 2017-03-19 DIAGNOSIS — M25511 Pain in right shoulder: Secondary | ICD-10-CM | POA: Diagnosis present

## 2017-03-19 DIAGNOSIS — W19XXXA Unspecified fall, initial encounter: Secondary | ICD-10-CM

## 2017-03-19 DIAGNOSIS — I129 Hypertensive chronic kidney disease with stage 1 through stage 4 chronic kidney disease, or unspecified chronic kidney disease: Secondary | ICD-10-CM | POA: Diagnosis not present

## 2017-03-19 DIAGNOSIS — N189 Chronic kidney disease, unspecified: Secondary | ICD-10-CM | POA: Insufficient documentation

## 2017-03-19 HISTORY — DX: Calculus of kidney: N20.0

## 2017-03-19 MED ORDER — FENTANYL CITRATE (PF) 100 MCG/2ML IJ SOLN
100.0000 ug | INTRAMUSCULAR | Status: DC | PRN
  Administered 2017-03-19: 100 ug via INTRAVENOUS
  Filled 2017-03-19: qty 2

## 2017-03-19 MED ORDER — TRAMADOL HCL 50 MG PO TABS: 50.0000 mg | ORAL_TABLET | Freq: Four times a day (QID) | ORAL | 0 refills | Status: DC | PRN

## 2017-03-19 MED ORDER — PROMETHAZINE HCL 25 MG/ML IJ SOLN
12.5000 mg | Freq: Four times a day (QID) | INTRAMUSCULAR | Status: DC | PRN
Start: 2017-03-19 — End: 2017-03-20
  Administered 2017-03-19: 12.5 mg via INTRAVENOUS
  Filled 2017-03-19: qty 1

## 2017-03-19 MED ORDER — TRAMADOL HCL 50 MG PO TABS
ORAL_TABLET | ORAL | Status: AC
  Administered 2017-03-19: 100 mg via ORAL
  Filled 2017-03-19: qty 1

## 2017-03-19 MED ORDER — TRAMADOL HCL 50 MG PO TABS
100.0000 mg | ORAL_TABLET | Freq: Once | ORAL | Status: AC
  Administered 2017-03-19: 100 mg via ORAL
  Filled 2017-03-19: qty 2

## 2017-03-19 NOTE — ED Provider Notes (Signed)
[facility] Emergency Department Provider Note    None    (approximate)  I have reviewed the triage vital signs and the nursing notes.   HISTORY  Chief Complaint Shoulder Injury    HPI Danny Klein is a 56 y.o. male presents with chief complaint of severe right shoulder pain that occurred after the patient slipped on wet grass and fell on the right side of his body hitting his right shoulder with sudden onset 10 out of 10 pain. Has had decreased movement secondary to pain. No numbness or tingling. No chest pain or shortness of breath. He is not on any blood thinners. No abdominal pain. Did not hit his head. No neck pain.   Past Medical History:  Diagnosis Date  . Abscess of abdominal wall   . Arthritis   . Chronic kidney disease    Kidney Stones  . GERD (gastroesophageal reflux disease)   . Hyperlipidemia   . Hypertension   . Kidney stones   . Sleep apnea    NO C-PAP   Family History  Problem Relation Age of Onset  . Cancer Mother   . Heart disease Mother   . Heart disease Father    Past Surgical History:  Procedure Laterality Date  . CARDIAC CATHETERIZATION  2009  . CHOLECYSTECTOMY    . LAMINECTOMY    . LAPAROSCOPY N/A 03/04/2016   Procedure: LAPAROSCOPY DIAGNOSTIC, REMOVAL OF RETAINED GALLSTONE;  Surgeon: Gladis Riffle, MD;  Location: [facility] ORS;  Service: General;  Laterality: N/A;  . PICC LINE PLACE PERIPHERAL ([facility] HX) Left 02/12/2016  . SPINAL FUSION W/ LUQUE UNIT ROD  2013   Patient Active Problem List   Diagnosis Date Noted  . Protein-calorie malnutrition, severe 02/20/2016  . Septic joint of right shoulder region (HCC) 02/18/2016  . Intra-abdominal infection   . Right upper quadrant pain   . Abdominal abscess 02/03/2016  . Essential hypertension 01/26/2016  . Hyperlipidemia 01/26/2016  . GERD (gastroesophageal reflux disease) 01/26/2016  . Anxiety 01/26/2016  . Obstructive sleep apnea syndrome 07/31/2014  .  Hypogonadism in male 12/17/2013  . Shoulder bursitis 11/04/2013  . Neck pain 11/04/2013  . Disorder of bursae and tendons in shoulder region 11/04/2013  . Pain in joint involving lower leg 03/07/2012  . Thoracic or lumbosacral neuritis or radiculitis 11/04/2011  . 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 Acquired spondylolisthesis 11/04/2011      Prior to Admission medications   Medication Sig Start Date End Date Taking? Authorizing Provider  amLODipine (NORVASC) 5 MG tablet Take 5 mg by mouth daily.   Yes [provider]  fluticasone (FLONASE) 50 MCG/ACT nasal spray Place 1 spray into both nostrils daily.   Yes [provider]  lisinopril-hydrochlorothiazide (PRINZIDE,ZESTORETIC) 20-25 MG tablet Take 1 tablet by mouth every other day.    Yes [provider]  omeprazole (PRILOSEC) 20 MG capsule Take 20 mg by mouth daily.   Yes [provider]  rosuvastatin (CRESTOR) 20 MG tablet Take 20 mg by mouth every evening.  12/10/15  Yes [provider]  traMADol (ULTRAM) 50 MG tablet Take 1 tablet (50 mg total) by mouth every 6 (six) hours as needed. 03/19/17 03/19/18  Willy Eddy, MD    Allergies Hydrocodone; Meperidine; and Levofloxacin    Social History Social History  Substance Use Topics  . Smoking status: Never Smoker  . Smokeless tobacco: Never Used  . Alcohol use 0.6 oz/week  1 Cans of beer per week     Comment: drinks occassionally; 1 can beer per week    Review of Systems Patient denies headaches, rhinorrhea, blurry vision, numbness, shortness of breath, chest pain, edema, cough, abdominal pain, nausea, vomiting, diarrhea, dysuria, fevers, rashes or hallucinations unless otherwise stated above in HPI. ____________________________________________   PHYSICAL EXAM:  VITAL SIGNS: There were no vitals filed for this visit.  Constitutional: Alert and oriented. Well appearing and in no acute  distress. Eyes: Conjunctivae are normal.  Head: Atraumatic. Nose: No congestion/rhinnorhea. Mouth/Throat: Mucous membranes are moist.   Neck: No stridor. Painless ROM.  Cardiovascular: Normal rate, regular rhythm. Grossly normal heart sounds.  Good peripheral circulation. Respiratory: Normal respiratory effort.  No retractions. Lungs CTAB. Gastrointestinal: Soft and nontender. No distention. No abdominal bruits. No CVA tenderness. Genitourinary:  Musculoskeletal: No lower extremity tenderness nor edema.  No joint effusions.  ttp of right shoulder without obvious deformity, no leceration,  SILT distally. No pain or arm, elbow or foream/hand Neurologic:  Normal speech and language. No gross focal neurologic deficits are appreciated. No facial droop Skin:  Skin is warm, dry and intact. No rash noted. Psychiatric: Mood and affect are normal. Speech and behavior are normal.  ____________________________________________   LABS (all labs ordered are listed, but only abnormal results are displayed)  No results found for this or any previous visit (from the past 24 hour(s)). ____________________________________________ ____________________________________________  RADIOLOGY  I personally reviewed all radiographic images ordered to evaluate for the above acute complaints and reviewed radiology reports and findings.  These findings were personally discussed with the patient.  Please see medical record for radiology report.  ____________________________________________   PROCEDURES  Procedure(s) performed:  Procedures    Critical Care performed: no ____________________________________________   INITIAL IMPRESSION / ASSESSMENT AND PLAN / ED COURSE  Pertinent labs & imaging results that were available during my care of the patient were reviewed by me and considered in my medical decision making (see chart for details).  DDX: fracture, contusion, sprain, tenonitis, ptx,  hemothorax  Danny Klein is a 56 y.o. who presents to the ED with chief complaint of right shoulder pain after mechanical fall as described above. No evidence of head injury. C-spine cleared with Nexus criteria. Abdominal exam is soft and benign. No chest wall pain or instability. Chest X rashes noted evidence of pneumothorax. X-ray of shoulder ordered to evaluate for evidence of fracture dislocation shows none. Does have some chronic degenerative changes and may have an occult fracture versus tendon or ligamentous injury therefore we'll place in sling immobilizer and have patient follow-up with orthopedics this week.      ____________________________________________   FINAL CLINICAL IMPRESSION(S) / ED DIAGNOSES  Final diagnoses:  Acute pain of right shoulder  Fall, initial encounter      NEW MEDICATIONS STARTED DURING THIS VISIT:  New Prescriptions   TRAMADOL (ULTRAM) 50 MG TABLET    Take 1 tablet (50 mg total) by mouth every 6 (six) hours as needed.     Note:  This document was prepared using Dragon voice recognition software and may include unintentional dictation errors.    Willy Eddy, MD 03/19/17 2017

## 2017-03-19 NOTE — ED Notes (Signed)
Patient transported to X-ray 

## 2017-03-19 NOTE — ED Triage Notes (Signed)
Pt says he fell within the last hour and fell onto his right side; pain and tenderness to right shoulder; unable to lift arm due to pain; denies any other injuries; no loss of consciousness;

## 2017-04-21 ENCOUNTER — Other Ambulatory Visit: Payer: Self-pay | Admitting: Orthopedic Surgery

## 2017-05-09 ENCOUNTER — Encounter
Admission: RE | Admit: 2017-05-09 | Discharge: 2017-05-09 | Disposition: A | Discharge status: Home / Self Care | Payer: BC Managed Care – PPO | Source: Ambulatory Visit | Attending: Orthopedic Surgery | Admitting: Orthopedic Surgery

## 2017-05-09 ENCOUNTER — Other Ambulatory Visit: Payer: Self-pay

## 2017-05-09 DIAGNOSIS — M75101 Unspecified rotator cuff tear or rupture of right shoulder, not specified as traumatic: Secondary | ICD-10-CM | POA: Insufficient documentation

## 2017-05-09 DIAGNOSIS — Z01812 Encounter for preprocedural laboratory examination: Secondary | ICD-10-CM | POA: Diagnosis present

## 2017-05-09 HISTORY — DX: Diverticulosis of intestine, part unspecified, without perforation or abscess without bleeding: K57.90

## 2017-05-09 HISTORY — DX: Anemia, unspecified: D64.9

## 2017-05-09 HISTORY — DX: Personal history of urinary calculi: Z87.442

## 2017-05-09 LAB — CBC WITH DIFFERENTIAL/PLATELET
Basophils Absolute: 0 10*3/uL (ref 0–0.1)
Basophils Relative: 0 %
Eosinophils Absolute: 0 10*3/uL (ref 0–0.7)
Eosinophils Relative: 0 %
HCT: 49.5 % (ref 40.0–52.0)
HEMOGLOBIN: 16.8 g/dL (ref 13.0–18.0)
LYMPHS ABS: 1.1 10*3/uL (ref 1.0–3.6)
LYMPHS PCT: 13 %
MCH: 30.6 pg (ref 26.0–34.0)
MCHC: 34 g/dL (ref 32.0–36.0)
MCV: 89.9 fL (ref 80.0–100.0)
Monocytes Absolute: 0.8 10*3/uL (ref 0.2–1.0)
Monocytes Relative: 10 %
NEUTROS ABS: 6.5 10*3/uL (ref 1.4–6.5)
NEUTROS PCT: 77 %
Platelets: 242 10*3/uL (ref 150–440)
RBC: 5.51 MIL/uL (ref 4.40–5.90)
RDW: 13.7 % (ref 11.5–14.5)
WBC: 8.5 10*3/uL (ref 3.8–10.6)

## 2017-05-09 LAB — PROTIME-INR
INR: 1
PROTHROMBIN TIME: 13.1 s (ref 11.4–15.2)

## 2017-05-09 LAB — APTT: aPTT: 28 seconds (ref 24–36)

## 2017-05-09 NOTE — Patient Instructions (Signed)
Your procedure is scheduled MV:HQIONGEXon:May 16, 2017 (TUESDAY ) Report to Same Day Surgery Second Floor , Medical Mall To find out your arrival time please call [phone] between 1PM - 3PM on May 15, 2017 (MONDAY ).  Remember: Instructions that are not followed completely may result in serious medical risk, up to and including death, or upon the discretion of your surgeon and anesthesiologist your surgery may need to be rescheduled.     _X__ 1. Do not eat food after midnight the night before your procedure.                 No gum chewing or hard candies. You may drink clear liquids up to 2 hours                 before you are scheduled to arrive for your surgery- DO not drink clear                 liquids within 2 hours of the start of your surgery.                 Clear Liquids include:  water, apple juice without pulp, clear carbohydrate                 drink such as Clearfast of Gartorade, Black Coffee or Tea (Do not add                 anything to coffee or tea).     _X__ 2.  No Alcohol for 24 hours before or after surgery.   _X__ 3.  Do Not Smoke or use e-cigarettes For 24 Hours Prior to Your Surgery.                 Do not use any chewable tobacco products for at least 6 hours prior to                 surgery.  ____  4.  Bring all medications with you on the day of surgery if instructed.   __X__  5.  Notify your doctor if there is any change in your medical condition      (cold, fever, infections).     Do not wear jewelry, make-up, hairpins, clips or nail polish. Do not wear lotions, powders, or perfumes.  Do not shave 48 hours prior to surgery. Men may shave face and neck. Do not bring valuables to the hospital.    [facility] not [facility] for any belongings or valuables.  Contacts, dentures or bridgework may not be worn into surgery. Leave your suitcase in the car. After surgery it may be brought to your room. For patients admitted to the  hospital, discharge time is determined by your treatment team.   Patients discharged the day of surgery will not be allowed to drive home.   Please read over the following fact sheets that you were given:             _X___ Take these medicines the morning of surgery with A SIP OF WATER:    1. AMLODIPINE  2. OMEPRAZOLE  3. OMEPRAZOLE AT BEDTIME ON MONDAY NIGHT  4.  5.  6.  ____ Fleet Enema (as directed)   __X__ Use CHG Soap as directed  ____ Use inhalers on the day of surgery  ____ Stop metformin 2 days prior to surgery    ____ Take 1/2 of usual insulin dose the night before surgery. No insulin  the morning          of surgery.   _X___ Stop Coumadin/Plavix/aspirin on (NO ASPIRIN )  _X___ Stop Anti-inflammatories on (NO ASPIRIN PRODUCTS )STOP IBUPROFEN NOW ) TYLENOL OR TRAMADOL IF NEEDED FOR PAIN   ____ Stop supplements until after surgery.    ____ Bring C-Pap to the hospital.

## 2017-05-15 ENCOUNTER — Encounter: Payer: Self-pay | Admitting: Anesthesiology

## 2017-05-16 ENCOUNTER — Ambulatory Visit: Payer: BC Managed Care – PPO | Admitting: Anesthesiology

## 2017-05-16 ENCOUNTER — Ambulatory Visit
Admission: RE | Admit: 2017-05-16 | Discharge: 2017-05-16 | Disposition: A | Discharge status: Home / Self Care | Payer: BC Managed Care – PPO | Source: Ambulatory Visit | Attending: Orthopedic Surgery | Admitting: Orthopedic Surgery

## 2017-05-16 ENCOUNTER — Encounter: Payer: Self-pay | Admitting: *Deleted

## 2017-05-16 ENCOUNTER — Encounter
Admission: RE | Disposition: A | Discharge status: Home / Self Care | Payer: Self-pay | Source: Ambulatory Visit | Attending: Orthopedic Surgery

## 2017-05-16 DIAGNOSIS — Z79899 Other long term (current) drug therapy: Secondary | ICD-10-CM | POA: Insufficient documentation

## 2017-05-16 DIAGNOSIS — I10 Essential (primary) hypertension: Secondary | ICD-10-CM | POA: Diagnosis not present

## 2017-05-16 DIAGNOSIS — Z79891 Long term (current) use of opiate analgesic: Secondary | ICD-10-CM | POA: Insufficient documentation

## 2017-05-16 DIAGNOSIS — Z791 Long term (current) use of non-steroidal anti-inflammatories (NSAID): Secondary | ICD-10-CM | POA: Diagnosis not present

## 2017-05-16 DIAGNOSIS — M19011 Primary osteoarthritis, right shoulder: Secondary | ICD-10-CM | POA: Diagnosis not present

## 2017-05-16 DIAGNOSIS — Z9049 Acquired absence of other specified parts of digestive tract: Secondary | ICD-10-CM | POA: Diagnosis not present

## 2017-05-16 DIAGNOSIS — Z885 Allergy status to narcotic agent status: Secondary | ICD-10-CM | POA: Insufficient documentation

## 2017-05-16 DIAGNOSIS — Z881 Allergy status to other antibiotic agents status: Secondary | ICD-10-CM | POA: Insufficient documentation

## 2017-05-16 DIAGNOSIS — Z9889 Other specified postprocedural states: Secondary | ICD-10-CM | POA: Diagnosis not present

## 2017-05-16 DIAGNOSIS — K219 Gastro-esophageal reflux disease without esophagitis: Secondary | ICD-10-CM | POA: Insufficient documentation

## 2017-05-16 DIAGNOSIS — Z7951 Long term (current) use of inhaled steroids: Secondary | ICD-10-CM | POA: Insufficient documentation

## 2017-05-16 DIAGNOSIS — Z888 Allergy status to other drugs, medicaments and biological substances status: Secondary | ICD-10-CM | POA: Diagnosis not present

## 2017-05-16 DIAGNOSIS — G4730 Sleep apnea, unspecified: Secondary | ICD-10-CM | POA: Diagnosis not present

## 2017-05-16 DIAGNOSIS — M75111 Incomplete rotator cuff tear or rupture of right shoulder, not specified as traumatic: Secondary | ICD-10-CM | POA: Insufficient documentation

## 2017-05-16 DIAGNOSIS — M75101 Unspecified rotator cuff tear or rupture of right shoulder, not specified as traumatic: Secondary | ICD-10-CM | POA: Diagnosis present

## 2017-05-16 HISTORY — PX: SHOULDER ARTHROSCOPY WITH OPEN ROTATOR CUFF REPAIR: SHX6092

## 2017-05-16 SURGERY — ARTHROSCOPY, SHOULDER WITH REPAIR, ROTATOR CUFF, OPEN
Anesthesia: General | Site: Shoulder | Laterality: Right

## 2017-05-16 MED ORDER — PHENYLEPHRINE HCL 10 MG/ML IJ SOLN
INTRAMUSCULAR | Status: DC | PRN
  Administered 2017-05-16: 100 ug via INTRAVENOUS

## 2017-05-16 MED ORDER — PROPOFOL 10 MG/ML IV BOLUS
INTRAVENOUS | Status: DC | PRN
  Administered 2017-05-16: 180 mg via INTRAVENOUS

## 2017-05-16 MED ORDER — BUPIVACAINE-EPINEPHRINE (PF) 0.25% -1:200000 IJ SOLN
INTRAMUSCULAR | Status: DC | PRN
  Administered 2017-05-16: 30 mL

## 2017-05-16 MED ORDER — FENTANYL CITRATE (PF) 100 MCG/2ML IJ SOLN: 25.0000 ug | INTRAMUSCULAR | Status: DC | PRN

## 2017-05-16 MED ORDER — MIDAZOLAM HCL 2 MG/2ML IJ SOLN
INTRAMUSCULAR | Status: AC
  Administered 2017-05-16: 1 mg via INTRAVENOUS
  Filled 2017-05-16: qty 2

## 2017-05-16 MED ORDER — ONDANSETRON HCL 4 MG/2ML IJ SOLN: 4.0000 mg | Freq: Once | INTRAMUSCULAR | Status: DC | PRN

## 2017-05-16 MED ORDER — FENTANYL CITRATE (PF) 100 MCG/2ML IJ SOLN
INTRAMUSCULAR | Status: AC
  Filled 2017-05-16: qty 2

## 2017-05-16 MED ORDER — CEFAZOLIN SODIUM-DEXTROSE 2-4 GM/100ML-% IV SOLN
INTRAVENOUS | Status: AC
  Filled 2017-05-16: qty 100

## 2017-05-16 MED ORDER — NEOMYCIN-POLYMYXIN B GU 40-200000 IR SOLN
Status: AC
  Filled 2017-05-16: qty 2

## 2017-05-16 MED ORDER — MIDAZOLAM HCL 2 MG/2ML IJ SOLN
1.0000 mg | Freq: Once | INTRAMUSCULAR | Status: AC
  Administered 2017-05-16: 1 mg via INTRAVENOUS

## 2017-05-16 MED ORDER — ONDANSETRON HCL 4 MG/2ML IJ SOLN
INTRAMUSCULAR | Status: DC | PRN
  Administered 2017-05-16: 4 mg via INTRAVENOUS

## 2017-05-16 MED ORDER — SUGAMMADEX SODIUM 500 MG/5ML IV SOLN
INTRAVENOUS | Status: AC
  Filled 2017-05-16: qty 5

## 2017-05-16 MED ORDER — ROPIVACAINE HCL 5 MG/ML IJ SOLN
INTRAMUSCULAR | Status: DC | PRN
  Administered 2017-05-16: 20 mL via EPIDURAL

## 2017-05-16 MED ORDER — MIDAZOLAM HCL 2 MG/2ML IJ SOLN
INTRAMUSCULAR | Status: DC | PRN
  Administered 2017-05-16: 2 mg via INTRAVENOUS

## 2017-05-16 MED ORDER — EPINEPHRINE 30 MG/30ML IJ SOLN
INTRAMUSCULAR | Status: AC
  Filled 2017-05-16: qty 1

## 2017-05-16 MED ORDER — OXYCODONE HCL 5 MG PO TABS: 5.0000 mg | ORAL_TABLET | ORAL | 0 refills | Status: AC | PRN

## 2017-05-16 MED ORDER — CHLORHEXIDINE GLUCONATE CLOTH 2 % EX PADS: 6.0000 | MEDICATED_PAD | Freq: Once | CUTANEOUS | Status: DC

## 2017-05-16 MED ORDER — EPINEPHRINE PF 1 MG/ML IJ SOLN
INTRAMUSCULAR | Status: DC | PRN
  Administered 2017-05-16: 15 mL

## 2017-05-16 MED ORDER — DEXAMETHASONE SODIUM PHOSPHATE 10 MG/ML IJ SOLN
INTRAMUSCULAR | Status: AC
  Filled 2017-05-16: qty 1

## 2017-05-16 MED ORDER — ACETAMINOPHEN 10 MG/ML IV SOLN
INTRAVENOUS | Status: DC | PRN
  Administered 2017-05-16: 1000 mg via INTRAVENOUS

## 2017-05-16 MED ORDER — LACTATED RINGERS IV SOLN
INTRAVENOUS | Status: DC
  Administered 2017-05-16: 07:00:00 via INTRAVENOUS

## 2017-05-16 MED ORDER — ROCURONIUM BROMIDE 50 MG/5ML IV SOLN
INTRAVENOUS | Status: AC
Start: 2017-05-16 — End: 2017-05-16
  Filled 2017-05-16: qty 1

## 2017-05-16 MED ORDER — SUCCINYLCHOLINE CHLORIDE 20 MG/ML IJ SOLN
INTRAMUSCULAR | Status: DC | PRN
  Administered 2017-05-16: 140 mg via INTRAVENOUS

## 2017-05-16 MED ORDER — ONDANSETRON HCL 4 MG PO TABS: 4.0000 mg | ORAL_TABLET | Freq: Three times a day (TID) | ORAL | 0 refills | Status: AC | PRN

## 2017-05-16 MED ORDER — FENTANYL CITRATE (PF) 100 MCG/2ML IJ SOLN
50.0000 ug | Freq: Once | INTRAMUSCULAR | Status: AC
  Administered 2017-05-16: 50 ug via INTRAVENOUS

## 2017-05-16 MED ORDER — NEOMYCIN-POLYMYXIN B GU 40-200000 IR SOLN
Status: DC | PRN
  Administered 2017-05-16: 2 mL

## 2017-05-16 MED ORDER — MIDAZOLAM HCL 2 MG/2ML IJ SOLN
INTRAMUSCULAR | Status: AC
  Filled 2017-05-16: qty 2

## 2017-05-16 MED ORDER — BUPIVACAINE HCL (PF) 0.25 % IJ SOLN
INTRAMUSCULAR | Status: AC
  Filled 2017-05-16: qty 30

## 2017-05-16 MED ORDER — GLYCOPYRROLATE 0.2 MG/ML IJ SOLN
INTRAMUSCULAR | Status: DC | PRN
  Administered 2017-05-16: 0.2 mg via INTRAVENOUS

## 2017-05-16 MED ORDER — PROPOFOL 500 MG/50ML IV EMUL
INTRAVENOUS | Status: AC
  Filled 2017-05-16: qty 50

## 2017-05-16 MED ORDER — ROPIVACAINE HCL 5 MG/ML IJ SOLN
INTRAMUSCULAR | Status: AC
  Filled 2017-05-16: qty 30

## 2017-05-16 MED ORDER — FENTANYL CITRATE (PF) 100 MCG/2ML IJ SOLN
INTRAMUSCULAR | Status: AC
  Administered 2017-05-16: 50 ug via INTRAVENOUS
  Filled 2017-05-16: qty 2

## 2017-05-16 MED ORDER — CEFAZOLIN SODIUM-DEXTROSE 2-4 GM/100ML-% IV SOLN
2.0000 g | INTRAVENOUS | Status: AC
  Administered 2017-05-16: 2 g via INTRAVENOUS

## 2017-05-16 MED ORDER — SUGAMMADEX SODIUM 500 MG/5ML IV SOLN
INTRAVENOUS | Status: DC | PRN
  Administered 2017-05-16: 130 mg via INTRAVENOUS
  Administered 2017-05-16: 100 mg via INTRAVENOUS

## 2017-05-16 MED ORDER — ROCURONIUM BROMIDE 100 MG/10ML IV SOLN
INTRAVENOUS | Status: DC | PRN
  Administered 2017-05-16 (×2): 20 mg via INTRAVENOUS
  Administered 2017-05-16: 50 mg via INTRAVENOUS

## 2017-05-16 MED ORDER — ACETAMINOPHEN 10 MG/ML IV SOLN
INTRAVENOUS | Status: AC
  Filled 2017-05-16: qty 100

## 2017-05-16 MED ORDER — SODIUM CHLORIDE 0.9 % IV SOLN
INTRAVENOUS | Status: DC | PRN
  Administered 2017-05-16: 15 ug/min via INTRAVENOUS

## 2017-05-16 MED ORDER — DEXAMETHASONE SODIUM PHOSPHATE 10 MG/ML IJ SOLN
INTRAMUSCULAR | Status: DC | PRN
  Administered 2017-05-16: 10 mg via INTRAVENOUS

## 2017-05-16 MED ORDER — ONDANSETRON HCL 4 MG/2ML IJ SOLN
INTRAMUSCULAR | Status: AC
  Filled 2017-05-16: qty 2

## 2017-05-16 MED ORDER — FENTANYL CITRATE (PF) 100 MCG/2ML IJ SOLN
INTRAMUSCULAR | Status: DC | PRN
  Administered 2017-05-16 (×2): 50 ug via INTRAVENOUS

## 2017-05-16 SURGICAL SUPPLY — 74 items
ADAPTER IRRIG TUBE 2 SPIKE SOL (ADAPTER) ×6 IMPLANT
ANCHOR ALL-SUT Q-FIX 2.8 (Anchor) ×3 IMPLANT
BUR RADIUS 4.0X18.5 (BURR) ×3 IMPLANT
BUR RADIUS 5.5 (BURR) ×3 IMPLANT
CANISTER SUCT LVC 12 LTR MEDI- (MISCELLANEOUS) IMPLANT
CANNULA 5.75X7 CRYSTAL CLEAR (CANNULA) ×6 IMPLANT
CANNULA PARTIAL THREAD 2X7 (CANNULA) ×3 IMPLANT
CANNULA TWIST IN 8.25X9CM (CANNULA) IMPLANT
CLOSURE WOUND 1/2 X4 (GAUZE/BANDAGES/DRESSINGS) ×2
CONNECTOR PERFECT PASSER (CONNECTOR) ×3 IMPLANT
COOLER POLAR GLACIER W/PUMP (MISCELLANEOUS) ×3 IMPLANT
CRADLE LAMINECT ARM (MISCELLANEOUS) ×3 IMPLANT
DEVICE SUCT BLK HOLE OR FLOOR (MISCELLANEOUS) ×3 IMPLANT
DRAPE IMP U-DRAPE 54X76 (DRAPES) ×6 IMPLANT
DRAPE INCISE IOBAN 66X45 STRL (DRAPES) ×3 IMPLANT
DRAPE SHEET LG 3/4 BI-LAMINATE (DRAPES) ×3 IMPLANT
DRAPE U-SHAPE 47X51 STRL (DRAPES) IMPLANT
DURAPREP 26ML APPLICATOR (WOUND CARE) ×15 IMPLANT
ELECT REM PT RETURN 9FT ADLT (ELECTROSURGICAL) ×3
ELECTRODE REM PT RTRN 9FT ADLT (ELECTROSURGICAL) ×1 IMPLANT
GAUZE PETRO XEROFOAM 1X8 (MISCELLANEOUS) ×3 IMPLANT
GAUZE SPONGE 4X4 12PLY STRL (GAUZE/BANDAGES/DRESSINGS) ×6 IMPLANT
GLOVE BIO SURGEON STRL SZ7 (GLOVE) ×18 IMPLANT
GLOVE BIOGEL PI IND STRL 9 (GLOVE) ×1 IMPLANT
GLOVE BIOGEL PI INDICATOR 9 (GLOVE) ×2
GLOVE SURG 9.0 ORTHO LTXF (GLOVE) ×6 IMPLANT
GOWN STRL REUS TWL 2XL XL LVL4 (GOWN DISPOSABLE) ×3 IMPLANT
GOWN STRL REUS W/ TWL LRG LVL3 (GOWN DISPOSABLE) ×2 IMPLANT
GOWN STRL REUS W/ TWL LRG LVL4 (GOWN DISPOSABLE) ×1 IMPLANT
GOWN STRL REUS W/TWL LRG LVL3 (GOWN DISPOSABLE) ×4
GOWN STRL REUS W/TWL LRG LVL4 (GOWN DISPOSABLE) ×2
IV LACTATED RINGER IRRG 3000ML (IV SOLUTION) ×30
IV LR IRRIG 3000ML ARTHROMATIC (IV SOLUTION) ×15 IMPLANT
KIT RM TURNOVER STRD PROC AR (KITS) ×3 IMPLANT
KIT STABILIZATION SHOULDER (MISCELLANEOUS) ×3 IMPLANT
KIT SUTURE 2.8 Q-FIX DISP (MISCELLANEOUS) ×3 IMPLANT
KIT SUTURETAK 3.0 INSERT PERC (KITS) IMPLANT
MANIFOLD NEPTUNE II (INSTRUMENTS) ×3 IMPLANT
MASK FACE SPIDER DISP (MASK) ×3 IMPLANT
MAT BLUE FLOOR 46X72 FLO (MISCELLANEOUS) ×6 IMPLANT
NDL SAFETY 18GX1.5 (NEEDLE) ×3 IMPLANT
NDL SAFETY 22GX1.5 (NEEDLE) ×3 IMPLANT
NEEDLE SPNL 18GX3.5 QUINCKE PK (NEEDLE) ×3 IMPLANT
NS IRRIG 500ML POUR BTL (IV SOLUTION) ×6 IMPLANT
PACK ARTHROSCOPY SHOULDER (MISCELLANEOUS) ×3 IMPLANT
PAD WRAPON POLAR SHDR XLG (MISCELLANEOUS) ×1 IMPLANT
PASSER SUT CAPTURE FIRST (SUTURE) ×3 IMPLANT
SET TUBE SUCT SHAVER OUTFL 24K (TUBING) ×3 IMPLANT
SET TUBE TIP INTRA-ARTICULAR (MISCELLANEOUS) ×3 IMPLANT
STRAP SAFETY BODY (MISCELLANEOUS) ×3 IMPLANT
STRIP CLOSURE SKIN 1/2X4 (GAUZE/BANDAGES/DRESSINGS) ×4 IMPLANT
SUT ETHILON 4-0 (SUTURE) ×2
SUT ETHILON 4-0 FS2 18XMFL BLK (SUTURE) ×1
SUT KNTLS 2.8 MAGNUM (Anchor) ×12 IMPLANT
SUT LASSO 90 DEG SD STR (SUTURE) IMPLANT
SUT MNCRL 4-0 (SUTURE) ×2
SUT MNCRL 4-0 27XMFL (SUTURE) ×1
SUT PDS AB 0 CT1 27 (SUTURE) ×3 IMPLANT
SUT PERFECTPASSER WHITE CART (SUTURE) ×15 IMPLANT
SUT SMART STITCH CARTRIDGE (SUTURE) ×6 IMPLANT
SUT VIC AB 0 CT1 36 (SUTURE) ×3 IMPLANT
SUT VIC AB 2-0 CT2 27 (SUTURE) ×3 IMPLANT
SUTURE ETHLN 4-0 FS2 18XMF BLK (SUTURE) ×1 IMPLANT
SUTURE MAGNUM WIRE 2X48 BLK (SUTURE) IMPLANT
SUTURE MNCRL 4-0 27XMF (SUTURE) ×1 IMPLANT
SYR 10ML 18GX1 1/2 (NEEDLE) ×3 IMPLANT
SYR 5ML 18GX1 1/2 (NEEDLE) ×3 IMPLANT
SYRINGE 10CC LL (SYRINGE) ×3 IMPLANT
TAPE MICROFOAM 4IN (TAPE) ×3 IMPLANT
TUBING ARTHRO INFLOW-ONLY STRL (TUBING) ×3 IMPLANT
TUBING CONNECTING 10 (TUBING) ×2 IMPLANT
TUBING CONNECTING 10' (TUBING) ×1
WAND HAND CNTRL MULTIVAC 90 (MISCELLANEOUS) ×3 IMPLANT
WRAPON POLAR PAD SHDR XLG (MISCELLANEOUS) ×3

## 2017-05-16 NOTE — Anesthesia Postprocedure Evaluation (Signed)
Anesthesia Post Note  Patient: Danny DownsSamuel V Klein  Procedure(s) Performed: SHOULDER ARTHROSCOPY WITH OPEN ROTATOR CUFF REPAIR,SUBACROMINAL DECOMPRESSION, DISTAL CLAVICLE EXCISION. (Right Shoulder)  Patient location during evaluation: PACU Anesthesia Type: General Level of consciousness: awake and alert Pain management: pain level controlled Vital Signs Assessment: post-procedure vital signs reviewed and stable Respiratory status: spontaneous breathing, nonlabored ventilation, respiratory function stable and patient connected to nasal cannula oxygen Cardiovascular status: blood pressure returned to baseline and stable Postop Assessment: no apparent nausea or vomiting Anesthetic complications: no     Last Vitals:  Vitals:   05/16/17 1155 05/16/17 1213  BP: 111/65 (!) 126/57  Pulse: 84 85  Resp: 15 16  Temp:  37 C  SpO2: 95% 97%    Last Pain:  Vitals:   05/16/17 1213  TempSrc: Oral  PainSc:                  Ryhanna Dunsmore S

## 2017-05-16 NOTE — Progress Notes (Signed)
Sprite given to patient.

## 2017-05-16 NOTE — Op Note (Signed)
05/16/2017  11:53 AM  PATIENT:  Danny Klein  56 y.o. male  PRE-OPERATIVE DIAGNOSIS:  High grade partial vs. Full thickness Tear of the rotator cuff with subacromial impingement and acromioclavicular arthrosis  POST-OPERATIVE DIAGNOSIS: Complex, high grade partial tear of the rotator cuff with subacromial impingement and acromioclavicular arthrosis  PROCEDURE:  Procedure(s): RIGHT SHOULDER ARTHROSCOPIC SUBACROMINAL DECOMPRESSION, DISTAL CLAVICLE EXCISION AND MINI-OPEN ROTATOR CUFF REPAIR  SURGEON:  Surgeon(s) and Role:    Thornton Park, MD - Primary  ANESTHESIA:   local, general and paracervical block   PREOPERATIVE INDICATIONS:  Danny Klein is a  56 y.o. male with a diagnosis of High grade partial vs. full thickness tear of the rotator cuff with subacromial impingement and acromioclavicular arthrosis  The risks benefits and alternatives were discussed with the patient preoperatively including but not limited to the risks of infection, bleeding, nerve injury, persistent pain or weakness, failure of the hardware, re-tear of the rotator cuff and the need for further surgery. Medical risks include DVT and pulmonary embolism, myocardial infarction, stroke, pneumonia, respiratory failure and death. Patient understood these risks and wished to proceed.  OPERATIVE IMPLANTS: ArthroCare Magnum 2 anchors x 4 & Smith and Nephew Q Fix anchors x 1  OPERATIVE FINDINGS: complex tear of the rotator cuff involving high-grade partial-thickness tear of the supraspinatus from the greater tuberosity footprint as well as a large interstitial component with posterior displacement of the infraspinatus.  OPERATIVE PROCEDURE: The patient was met in the preoperative area. The right shoulder was signed with the word yes and my initials according the hospital's correct site of surgery protocol.   History and physical was updated.  Patient underwent an interscalene block by the anesthesia service.   Patient was brought to the operating room where he underwent general anesthesia.  The patient was placed in a beachchair position.  A spider arm positioner was used for this case. Examination under anesthesia revealed no limitation of motion or instability with load shift testing. The patient had a negative sulcus sign.  Patient was prepped and draped in a sterile fashion. A timeout was performed to verify the patient's name, date of birth, medical record number, correct site of surgery and correct procedure to be performed there was also used to verify the patient received antibiotics that all appropriate instruments, implants and radiographs studies were available in the room. Once all in attendance were in agreement case began.  Bony landmarks were drawn out with a surgical marker along with proposed arthroscopy incisions. These were pre-injected with 1% lidocaine plain. An 11 blade was used to establish a posterior portal through which the arthroscope was placed in the glenohumeral joint. A full diagnostic examination of the shoulder was performed.  Patient was found to have a high-grade partial-thickness tear. An 18-gauge spinal needle was used to place a  0 PDS suture through the rotator cuff tear for identification from the bursal side.  The arthroscope was then placed in the subacromial space.    Extensive bursitis was encountered and debrided using a 4-0 resector shaver blade and a 90 ArthroCare wand from a lateral portal which was established under direct visualization using an 18-gauge spinal needle. A subacromial decompression was also performed using a 5.5 mm resector shaver blade from the lateral portal. 0 PDS suture was identified.  The partial-thickness tear was then completed with a 4.0 resector shaver blade.  The reater tuberosity was then debrided of torn rotator cuff fibers using a 4 resector shaver blade until  punctate bleeding was identified.  Three Perfect Pass suture were placed in the  lateral border of the rotator cuff tear 2 in the supraspinatus and one in the infraspinatus.  All arthroscopic instruments were then removed and the mini-open portion of the procedure began.  A saber-type incision was made along the lateral border of the acromion. The deltoid muscle was identified and split in line with its fibers which allowed visualization of the rotator cuff. The Perfect Pass sutures previously placed in the lateral border of the rotator cuff were brought out through the deltoid split.  A Smith and [employer] Fix anchor was placed at the articular margin of the humeral head with the greater tuberosity. The suture limbs of the Q Fix anchor were passed medially through the rotator cuff using a First Pass suture passer.   A second Perfect Pass suture was placed into the infraspinatus.  The four Perfect Pass sutures were then anchored to the greater tuberosity footprint using four Magnum 2 anchors. These anchors were tensioned to allow for anatomic reduction of the rotator cuff to the greater tuberosity. The medial row sutures were then tied down using an arthroscopic knot tying technique.  Arthroscopic images of the repair were taken with the arthroscope both externally and from inside the glenohumeral joint.  All incisions were copiously irrigated. The deltoid fascia was repaired using a 0 Vicryl suture.  The subcutaneous tissue of all incisions were closed with a 2-0 Vicryl. Skin closure for the arthroscopic incisions was performed with 4-0 nylon. The skin edges of the saber incision was approximated with a running 4-0 undyed Monocryl.  0.25% Marcaine plain was then injected into the subacromial space for postoperative pain control. A dry sterile dressing was applied.  The patient was placed in an abduction sling and a Polar Care was applied to the shoulder.  All sharp and it instrument counts were correct at the conclusion of the case. I was scrubbed and present for the entire case. I spoke  with the patient's wife postoperatively to let her know the case had been performed without complication and the patient was stable in recovery room.

## 2017-05-16 NOTE — H&P (Signed)
The patient has been re-examined, and the chart reviewed, and there have been no interval changes to the documented history and physical.    The risks, benefits, and alternatives have been discussed at length, and the patient is willing to proceed.   

## 2017-05-16 NOTE — Discharge Instructions (Signed)

## 2017-05-16 NOTE — Anesthesia Procedure Notes (Signed)
Anesthesia Regional Block: Interscalene brachial plexus block   Pre-Anesthetic Checklist: ,, timeout performed, Correct Patient, Correct Site, Correct Laterality, Correct Procedure, Correct Position, site marked, Risks and benefits discussed,  Surgical consent,  Pre-op evaluation,  At surgeon's request and post-op pain management   Prep: Betadine       Needles:  Injection technique: Single-shot  Needle Type: Echogenic Stimulator Needle     Needle Length: 5cm  Needle Gauge: 21     Additional Needles:   Procedures:, nerve stimulator,,,,,,,   Nerve Stimulator or Paresthesia:  Response: biceps flexion, 0.8 mA,   Additional Responses:   Narrative:  Injection made incrementally with aspirations every 5 mL.  Performed by: Personally  Anesthesiologist: Seve Monette, MD  Additional Notes: Functioning IV was confirmed and monitors were applied.  A 50mm 22ga Arrow echogenic stimulator needle was used. Sterile prep and drape,hand hygiene and sterile gloves were used.  Negative aspiration and negative test dose prior to incremental administration of local anesthetic. The patient tolerated the procedure well.  Ultrasound guidance: relevent anatomy identified, needle position confirmed, local anesthetic spread visualized around nerve(s), vascular puncture avoided.  Image printed for medical record.       

## 2017-05-16 NOTE — Anesthesia Preprocedure Evaluation (Signed)
Anesthesia Evaluation  Patient identified by MRN, date of birth, ID band Patient awake    Reviewed: Allergy & Precautions, NPO status , Patient's Chart, lab work & pertinent test results, reviewed documented beta blocker date and time   Airway Mallampati: III  TM Distance: >3 FB     Dental  (+) Chipped   Pulmonary sleep apnea ,           Cardiovascular hypertension, Pt. on medications      Neuro/Psych Anxiety    GI/Hepatic GERD  Controlled,  Endo/Other    Renal/GU Renal disease     Musculoskeletal  (+) Arthritis ,   Abdominal   Peds  Hematology  (+) anemia ,   Anesthesia Other Findings Obese.   Reproductive/Obstetrics                             Anesthesia Physical Anesthesia Plan  ASA: III  Anesthesia Plan: General   Post-op Pain Management:    Induction: Intravenous  PONV Risk Score and Plan:   Airway Management Planned: Oral ETT  Additional Equipment:   Intra-op Plan:   Post-operative Plan:   Informed Consent: I have reviewed the patients History and Physical, chart, labs and discussed the procedure including the risks, benefits and alternatives for the proposed anesthesia with the patient or authorized representative who has indicated his/her understanding and acceptance.     Plan Discussed with: CRNA  Anesthesia Plan Comments:         Anesthesia Quick Evaluation

## 2017-05-16 NOTE — Anesthesia Post-op Follow-up Note (Signed)
Anesthesia QCDR form completed.        

## 2017-05-16 NOTE — Transfer of Care (Signed)
Immediate Anesthesia Transfer of Care Note  Patient: Danny Klein  Procedure(s) Performed: SHOULDER ARTHROSCOPY WITH OPEN ROTATOR CUFF REPAIR,SUBACROMINAL DECOMPRESSION, DISTAL CLAVICLE EXCISION. (Right Shoulder)  Patient Location: PACU  Anesthesia Type:General  Level of Consciousness: sedated and responds to stimulation  Airway & Oxygen Therapy: Patient Spontanous Breathing and Patient connected to face mask oxygen  Post-op Assessment: Report given to RN and Post -op Vital signs reviewed and stable  Post vital signs: Reviewed and stable  Last Vitals:  Vitals:   05/16/17 0659 05/16/17 1125  BP: (!) 117/59 126/72  Pulse: 71   Resp: 18 (!) 32  Temp:  36.9 C  SpO2: 98%     Last Pain:  Vitals:   05/16/17 0611  TempSrc: Temporal  PainSc: 2       Patients Stated Pain Goal: 0 (05/16/17 [id number])  Complications: No apparent anesthesia complications

## 2017-05-16 NOTE — Anesthesia Procedure Notes (Signed)
Procedure Name: Intubation Date/Time: 05/16/2017 8:03 AM Performed by: Omer JackWeatherly, Zanae Kuehnle, CRNA Pre-anesthesia Checklist: Patient identified, Patient being monitored, Timeout performed, Emergency Drugs available and Suction available Patient Re-evaluated:Patient Re-evaluated prior to induction Oxygen Delivery Method: Circle system utilized Preoxygenation: Pre-oxygenation with 100% oxygen Induction Type: IV induction Ventilation: Mask ventilation without difficulty Laryngoscope Size: McGraph and 4 Grade View: Grade I Tube type: Oral Tube size: 7.5 mm Number of attempts: 1 Airway Equipment and Method: Stylet Placement Confirmation: ETT inserted through vocal cords under direct vision,  positive ETCO2 and breath sounds checked- equal and bilateral Secured at: 22 cm Tube secured with: Tape Dental Injury: Teeth and Oropharynx as per pre-operative assessment

## 2017-05-16 NOTE — Progress Notes (Signed)
Patient able to move his fingers, sensation intact And warm to touch.  Color pink and normal.

## 2017-05-17 ENCOUNTER — Encounter: Payer: Self-pay | Admitting: Orthopedic Surgery
